# Patient Record
Sex: Male | Born: 1955
Health system: Southern US, Community
[De-identification: ages and names within clinical notes are randomized; demographics above are authoritative.]

## PROBLEM LIST (undated history)

## (undated) DIAGNOSIS — M720 Palmar fascial fibromatosis [Dupuytren]: Secondary | ICD-10-CM

## (undated) HISTORY — PX: CLEFT PALATE REPAIR: SUR1165

---

## 2019-06-01 DIAGNOSIS — F17210 Nicotine dependence, cigarettes, uncomplicated: Secondary | ICD-10-CM | POA: Diagnosis not present

## 2019-06-01 DIAGNOSIS — M1710 Unilateral primary osteoarthritis, unspecified knee: Secondary | ICD-10-CM | POA: Diagnosis not present

## 2019-06-01 DIAGNOSIS — Z299 Encounter for prophylactic measures, unspecified: Secondary | ICD-10-CM | POA: Diagnosis not present

## 2019-06-01 DIAGNOSIS — M720 Palmar fascial fibromatosis [Dupuytren]: Secondary | ICD-10-CM | POA: Diagnosis not present

## 2019-07-27 DIAGNOSIS — Z7189 Other specified counseling: Secondary | ICD-10-CM | POA: Diagnosis not present

## 2019-07-27 DIAGNOSIS — F17210 Nicotine dependence, cigarettes, uncomplicated: Secondary | ICD-10-CM | POA: Diagnosis not present

## 2019-07-27 DIAGNOSIS — Z1211 Encounter for screening for malignant neoplasm of colon: Secondary | ICD-10-CM | POA: Diagnosis not present

## 2019-07-27 DIAGNOSIS — Z299 Encounter for prophylactic measures, unspecified: Secondary | ICD-10-CM | POA: Diagnosis not present

## 2019-07-27 DIAGNOSIS — Z0000 Encounter for general adult medical examination without abnormal findings: Secondary | ICD-10-CM | POA: Diagnosis not present

## 2019-07-27 DIAGNOSIS — Z79899 Other long term (current) drug therapy: Secondary | ICD-10-CM | POA: Diagnosis not present

## 2019-08-16 DIAGNOSIS — M720 Palmar fascial fibromatosis [Dupuytren]: Secondary | ICD-10-CM | POA: Diagnosis not present

## 2019-09-01 DIAGNOSIS — M720 Palmar fascial fibromatosis [Dupuytren]: Secondary | ICD-10-CM | POA: Diagnosis not present

## 2019-09-02 ENCOUNTER — Other Ambulatory Visit: Payer: Self-pay | Admitting: Orthopedic Surgery

## 2019-09-10 DIAGNOSIS — Z713 Dietary counseling and surveillance: Secondary | ICD-10-CM | POA: Diagnosis not present

## 2019-09-10 DIAGNOSIS — Z299 Encounter for prophylactic measures, unspecified: Secondary | ICD-10-CM | POA: Diagnosis not present

## 2019-09-10 DIAGNOSIS — N200 Calculus of kidney: Secondary | ICD-10-CM | POA: Diagnosis not present

## 2019-09-10 DIAGNOSIS — M1990 Unspecified osteoarthritis, unspecified site: Secondary | ICD-10-CM | POA: Diagnosis not present

## 2019-10-04 ENCOUNTER — Encounter (HOSPITAL_BASED_OUTPATIENT_CLINIC_OR_DEPARTMENT_OTHER): Payer: Self-pay | Admitting: Orthopedic Surgery

## 2019-10-04 ENCOUNTER — Other Ambulatory Visit: Payer: Self-pay

## 2019-10-08 ENCOUNTER — Other Ambulatory Visit (HOSPITAL_COMMUNITY)
Admission: RE | Admit: 2019-10-08 | Discharge: 2019-10-08 | Disposition: A | Discharge status: Home / Self Care | Payer: Medicare Other | Source: Ambulatory Visit | Attending: Orthopedic Surgery | Admitting: Orthopedic Surgery

## 2019-10-08 DIAGNOSIS — Z20822 Contact with and (suspected) exposure to covid-19: Secondary | ICD-10-CM | POA: Insufficient documentation

## 2019-10-08 DIAGNOSIS — Z01812 Encounter for preprocedural laboratory examination: Secondary | ICD-10-CM | POA: Diagnosis not present

## 2019-10-08 LAB — SARS CORONAVIRUS 2 (TAT 6-24 HRS): SARS Coronavirus 2: NEGATIVE

## 2019-10-08 NOTE — Progress Notes (Signed)

## 2019-10-12 ENCOUNTER — Encounter (HOSPITAL_BASED_OUTPATIENT_CLINIC_OR_DEPARTMENT_OTHER): Payer: Self-pay | Admitting: Orthopedic Surgery

## 2019-10-12 ENCOUNTER — Ambulatory Visit (HOSPITAL_BASED_OUTPATIENT_CLINIC_OR_DEPARTMENT_OTHER): Payer: Medicare Other | Admitting: Anesthesiology

## 2019-10-12 ENCOUNTER — Encounter (HOSPITAL_BASED_OUTPATIENT_CLINIC_OR_DEPARTMENT_OTHER)
Admission: RE | Disposition: A | Discharge status: Home / Self Care | Payer: Self-pay | Source: Home / Self Care | Attending: Orthopedic Surgery

## 2019-10-12 ENCOUNTER — Other Ambulatory Visit: Payer: Self-pay

## 2019-10-12 ENCOUNTER — Ambulatory Visit (HOSPITAL_BASED_OUTPATIENT_CLINIC_OR_DEPARTMENT_OTHER)
Admission: RE | Admit: 2019-10-12 | Discharge: 2019-10-12 | Disposition: A | Discharge status: Home / Self Care | Payer: Medicare Other | Attending: Orthopedic Surgery | Admitting: Orthopedic Surgery

## 2019-10-12 DIAGNOSIS — M720 Palmar fascial fibromatosis [Dupuytren]: Secondary | ICD-10-CM | POA: Diagnosis not present

## 2019-10-12 DIAGNOSIS — F17210 Nicotine dependence, cigarettes, uncomplicated: Secondary | ICD-10-CM | POA: Diagnosis not present

## 2019-10-12 DIAGNOSIS — M1990 Unspecified osteoarthritis, unspecified site: Secondary | ICD-10-CM | POA: Insufficient documentation

## 2019-10-12 HISTORY — PX: FASCIECTOMY: SHX6525

## 2019-10-12 HISTORY — DX: Palmar fascial fibromatosis (dupuytren): M72.0

## 2019-10-12 SURGERY — FASCIECTOMY, PALM
Anesthesia: Monitor Anesthesia Care | Site: Hand | Laterality: Right

## 2019-10-12 MED ORDER — PHENYLEPHRINE 40 MCG/ML (10ML) SYRINGE FOR IV PUSH (FOR BLOOD PRESSURE SUPPORT)
PREFILLED_SYRINGE | INTRAVENOUS | Status: AC
  Filled 2019-10-12: qty 10

## 2019-10-12 MED ORDER — DIPHENHYDRAMINE HCL 50 MG/ML IJ SOLN
INTRAMUSCULAR | Status: AC
  Filled 2019-10-12: qty 1

## 2019-10-12 MED ORDER — HYDROMORPHONE HCL 1 MG/ML IJ SOLN: 0.2500 mg | INTRAMUSCULAR | Status: DC | PRN

## 2019-10-12 MED ORDER — SUCCINYLCHOLINE CHLORIDE 200 MG/10ML IV SOSY
PREFILLED_SYRINGE | INTRAVENOUS | Status: AC
  Filled 2019-10-12: qty 10

## 2019-10-12 MED ORDER — PROPOFOL 500 MG/50ML IV EMUL
INTRAVENOUS | Status: DC | PRN
  Administered 2019-10-12: 75 ug/kg/min via INTRAVENOUS

## 2019-10-12 MED ORDER — CEFAZOLIN SODIUM-DEXTROSE 2-4 GM/100ML-% IV SOLN
INTRAVENOUS | Status: AC
  Filled 2019-10-12: qty 100

## 2019-10-12 MED ORDER — MIDAZOLAM HCL 2 MG/2ML IJ SOLN
2.0000 mg | Freq: Once | INTRAMUSCULAR | Status: AC
  Administered 2019-10-12: 2 mg via INTRAVENOUS

## 2019-10-12 MED ORDER — FENTANYL CITRATE (PF) 100 MCG/2ML IJ SOLN
100.0000 ug | Freq: Once | INTRAMUSCULAR | Status: AC
  Administered 2019-10-12: 100 ug via INTRAVENOUS

## 2019-10-12 MED ORDER — TRAMADOL HCL 50 MG PO TABS: 50.0000 mg | ORAL_TABLET | Freq: Four times a day (QID) | ORAL | 0 refills | Status: DC | PRN

## 2019-10-12 MED ORDER — FENTANYL CITRATE (PF) 100 MCG/2ML IJ SOLN
INTRAMUSCULAR | Status: AC
  Filled 2019-10-12: qty 2

## 2019-10-12 MED ORDER — LACTATED RINGERS IV SOLN: INTRAVENOUS | Status: DC

## 2019-10-12 MED ORDER — EPHEDRINE 5 MG/ML INJ
INTRAVENOUS | Status: AC
  Filled 2019-10-12: qty 10

## 2019-10-12 MED ORDER — PROMETHAZINE HCL 25 MG/ML IJ SOLN: 6.2500 mg | INTRAMUSCULAR | Status: DC | PRN

## 2019-10-12 MED ORDER — LIDOCAINE 2% (20 MG/ML) 5 ML SYRINGE
INTRAMUSCULAR | Status: AC
  Filled 2019-10-12: qty 5

## 2019-10-12 MED ORDER — LIDOCAINE 2% (20 MG/ML) 5 ML SYRINGE
INTRAMUSCULAR | Status: DC | PRN
  Administered 2019-10-12: 30 mg via INTRAVENOUS

## 2019-10-12 MED ORDER — OXYCODONE HCL 5 MG/5ML PO SOLN: 5.0000 mg | Freq: Once | ORAL | Status: DC | PRN

## 2019-10-12 MED ORDER — CEFAZOLIN SODIUM-DEXTROSE 2-4 GM/100ML-% IV SOLN
2.0000 g | INTRAVENOUS | Status: AC
  Administered 2019-10-12: 2 g via INTRAVENOUS

## 2019-10-12 MED ORDER — DIPHENHYDRAMINE HCL 50 MG/ML IJ SOLN
INTRAMUSCULAR | Status: DC | PRN
  Administered 2019-10-12: 6.25 mg via INTRAVENOUS

## 2019-10-12 MED ORDER — THROMBIN 5000 UNITS EX SOLR
CUTANEOUS | Status: DC | PRN
  Administered 2019-10-12: 5000 [IU] via TOPICAL

## 2019-10-12 MED ORDER — MIDAZOLAM HCL 2 MG/2ML IJ SOLN
INTRAMUSCULAR | Status: AC
  Filled 2019-10-12: qty 2

## 2019-10-12 MED ORDER — ROPIVACAINE HCL 5 MG/ML IJ SOLN
INTRAMUSCULAR | Status: DC | PRN
Start: 2019-10-12 — End: 2019-10-12
  Administered 2019-10-12: 30 mL via PERINEURAL

## 2019-10-12 MED ORDER — ONDANSETRON HCL 4 MG/2ML IJ SOLN
INTRAMUSCULAR | Status: DC | PRN
  Administered 2019-10-12: 4 mg via INTRAVENOUS

## 2019-10-12 MED ORDER — OXYCODONE HCL 5 MG PO TABS: 5.0000 mg | ORAL_TABLET | Freq: Once | ORAL | Status: DC | PRN

## 2019-10-12 SURGICAL SUPPLY — 45 items
BLADE MINI RND TIP GREEN BEAV (BLADE) ×3 IMPLANT
BLADE SURG 15 STRL LF DISP TIS (BLADE) ×1 IMPLANT
BLADE SURG 15 STRL SS (BLADE) ×2
BNDG COHESIVE 3X5 TAN STRL LF (GAUZE/BANDAGES/DRESSINGS) ×6 IMPLANT
BNDG ESMARK 4X9 LF (GAUZE/BANDAGES/DRESSINGS) ×3 IMPLANT
BNDG GAUZE ELAST 4 BULKY (GAUZE/BANDAGES/DRESSINGS) ×3 IMPLANT
CHLORAPREP W/TINT 26 (MISCELLANEOUS) ×3 IMPLANT
CORD BIPOLAR FORCEPS 12FT (ELECTRODE) ×3 IMPLANT
COVER BACK TABLE 60X90IN (DRAPES) ×3 IMPLANT
COVER MAYO STAND STRL (DRAPES) ×3 IMPLANT
COVER WAND RF STERILE (DRAPES) IMPLANT
CUFF TOURN SGL QUICK 18X4 (TOURNIQUET CUFF) ×3 IMPLANT
DECANTER SPIKE VIAL GLASS SM (MISCELLANEOUS) IMPLANT
DRAPE EXTREMITY T 121X128X90 (DISPOSABLE) ×3 IMPLANT
DRAPE SURG 17X23 STRL (DRAPES) ×3 IMPLANT
DRSG PAD ABDOMINAL 8X10 ST (GAUZE/BANDAGES/DRESSINGS) ×3 IMPLANT
GAUZE SPONGE 4X4 12PLY STRL (GAUZE/BANDAGES/DRESSINGS) ×3 IMPLANT
GAUZE XEROFORM 1X8 LF (GAUZE/BANDAGES/DRESSINGS) ×3 IMPLANT
GLOVE BIO SURGEON STRL SZ7 (GLOVE) ×3 IMPLANT
GLOVE BIOGEL M STRL SZ7.5 (GLOVE) ×3 IMPLANT
GLOVE BIOGEL PI IND STRL 6.5 (GLOVE) ×2 IMPLANT
GLOVE BIOGEL PI IND STRL 8.5 (GLOVE) ×1 IMPLANT
GLOVE BIOGEL PI INDICATOR 6.5 (GLOVE) ×4
GLOVE BIOGEL PI INDICATOR 8.5 (GLOVE) ×2
GLOVE SURG ORTHO 8.0 STRL STRW (GLOVE) ×3 IMPLANT
GOWN STRL REUS W/ TWL LRG LVL3 (GOWN DISPOSABLE) ×1 IMPLANT
GOWN STRL REUS W/TWL LRG LVL3 (GOWN DISPOSABLE) ×2
GOWN STRL REUS W/TWL XL LVL3 (GOWN DISPOSABLE) ×6 IMPLANT
LOOP VESSEL MAXI BLUE (MISCELLANEOUS) ×3 IMPLANT
NEEDLE PRECISIONGLIDE 27X1.5 (NEEDLE) ×3 IMPLANT
NS IRRIG 1000ML POUR BTL (IV SOLUTION) ×3 IMPLANT
PAD CAST 3X4 CTTN HI CHSV (CAST SUPPLIES) ×1 IMPLANT
PADDING CAST COTTON 3X4 STRL (CAST SUPPLIES) ×2
SET BASIN DAY SURGERY F.S. (CUSTOM PROCEDURE TRAY) ×3 IMPLANT
SLEEVE SCD COMPRESS KNEE MED (MISCELLANEOUS) ×3 IMPLANT
SLING ARM FOAM STRAP LRG (SOFTGOODS) ×3 IMPLANT
SPLINT PLASTER CAST XFAST 3X15 (CAST SUPPLIES) ×10 IMPLANT
SPLINT PLASTER XTRA FASTSET 3X (CAST SUPPLIES) ×20
STOCKINETTE 4X48 STRL (DRAPES) ×3 IMPLANT
SUT ETHILON 4 0 PS 2 18 (SUTURE) ×9 IMPLANT
SUT SILK 2 0 PERMA HAND 18 BK (SUTURE) ×3 IMPLANT
SYR BULB EAR ULCER 3OZ GRN STR (SYRINGE) ×3 IMPLANT
SYR CONTROL 10ML LL (SYRINGE) ×3 IMPLANT
TOWEL GREEN STERILE FF (TOWEL DISPOSABLE) ×6 IMPLANT
UNDERPAD 30X36 HEAVY ABSORB (UNDERPADS AND DIAPERS) ×3 IMPLANT

## 2019-10-12 NOTE — Discharge Instructions (Addendum)

## 2019-10-12 NOTE — Brief Op Note (Signed)
10/12/2019  10:06 AM  PATIENT:  Wesley Hunt  64 y.o. male  PRE-OPERATIVE DIAGNOSIS:  DUPUYTRENS CONTRACTURE  POST-OPERATIVE DIAGNOSIS:  DUPUYTRENS CONTRACTURE  PROCEDURE:  Procedure(s): FASCIECTOMY RIGHT RING AND RIGHT SMALL FINGER (Right)  SURGEON:  Surgeon(s) and Role:    * Daryll Brod, MD - Primary  PHYSICIAN ASSISTANT:   ASSISTANTS: Leverne Humbles PA-C ANESTHESIA:   regional and IV sedation  EBL:  2 mL   BLOOD ADMINISTERED:none  DRAINS: Vessel loop drains to both wounds   LOCAL MEDICATIONS USED:  NONE  SPECIMEN:  Excision  DISPOSITION OF SPECIMEN:  PATHOLOGY  COUNTS:  YES  TOURNIQUET:   Total Tourniquet Time Documented: Upper Arm (Right) - 65 minutes Total: Upper Arm (Right) - 65 minutes   DICTATION: .Viviann Spare Dictation  PLAN OF CARE: Discharge to home after PACU  PATIENT DISPOSITION:  PACU - hemodynamically stable.

## 2019-10-12 NOTE — Anesthesia Procedure Notes (Signed)
Anesthesia Regional Block: Supraclavicular block   Pre-Anesthetic Checklist: ,, timeout performed, Correct Patient, Correct Site, Correct Laterality, Correct Procedure, Correct Position, site marked, Risks and benefits discussed,  Surgical consent,  Pre-op evaluation,  At surgeon's request and post-op pain management  Laterality: Right  Prep: chloraprep       Needles:  Injection technique: Single-shot  Needle Type: Stimiplex     Needle Length: 9cm  Needle Gauge: 21     Additional Needles:   Procedures:,,,, ultrasound used (permanent image in chart),,,,  Narrative:  Start time: 10/12/2019 7:24 AM End time: 10/12/2019 7:29 AM Injection made incrementally with aspirations every 5 mL.  Performed by: Personally  Anesthesiologist: Lynda Rainwater, MD

## 2019-10-12 NOTE — Progress Notes (Signed)
Assisted Dr. Sabra Heck with right, ultrasound guided, supraclavicular block. Side rails up, monitors on throughout procedure. See vital signs in flow sheet. Tolerated Procedure well.

## 2019-10-12 NOTE — H&P (Signed)
  Wesley Hunt is an 64 y.o. male.   Chief Complaint: contracture ringand small rightHPI: Wesley Hunt is a 64 year old right-hand-dominant male referred by Dr. Woody Seller for consultation regarding contractures of his ring and small fingers right hand. He states is been present approximately 3 years. Recalls no history of injury. He states his brother had a similar problem which I took care of. He does not know his ancestry. He does not have lumps on his feet he does not have curvature of his penis. He has no history of injury. Is not complaining of any numbness or tingling. He has a history of arthritis no history of diabetes thyroid problems or gout. Family history is negative for each of these.   Past Medical History:  Diagnosis Date  . Dupuytren's contracture     Past Surgical History:  Procedure Laterality Date  . CLEFT PALATE REPAIR      History reviewed. No pertinent family history. Social History:  reports that he has been smoking cigarettes. He has been smoking about 0.25 packs per day. He has never used smokeless tobacco. He reports that he does not drink alcohol and does not use drugs.  Allergies: No Known Allergies  No medications prior to admission.    No results found for this or any previous visit (from the past 48 hour(s)).  No results found.   Pertinent items are noted in HPI.  Height 5\' 7"  (1.702 m), weight 83.5 kg.  General appearance: alert, cooperative and appears stated age Head: Normocephalic, without obvious abnormality Neck: no JVD Resp: clear to auscultation bilaterally Cardio: regular rate and rhythm, S1, S2 normal, no murmur, click, rub or gallop GI: soft, non-tender; bowel sounds normal; no masses,  no organomegaly Extremities: contractures ring and small right hand Pulses: 2+ and symmetric Skin: Skin color, texture, turgor normal. No rashes or lesions Neurologic: Grossly normal Incision/Wound: na  Assessment/Plan Contracture of palmar fascia    Plan:  He would like to proceed to have this surgically taken care of with a fasciectomy rather than fasciotomy. He is aware of risk and complications including infection recurrence injury to arteries nerves tendons incomplete relief symptoms dystrophy recurrence possibility of loss of finger stiffness he would like to proceed in this will be scheduled as an outpatient under regional anesthesia fasciectomy ring and small fingers right hand.   Daryll Brod 10/12/2019, 5:32 AM

## 2019-10-12 NOTE — Transfer of Care (Signed)
Immediate Anesthesia Transfer of Care Note  Patient: Wesley Hunt  Procedure(s) Performed: FASCIECTOMY RIGHT RING AND RIGHT SMALL FINGER (Right Hand)  Patient Location: PACU  Anesthesia Type:MAC and MAC combined with regional for post-op pain  Level of Consciousness: sedated  Airway & Oxygen Therapy: Patient Spontanous Breathing and Patient connected to face mask oxygen  Post-op Assessment: Report given to RN and Post -op Vital signs reviewed and stable  Post vital signs: Reviewed and stable  Last Vitals:  Vitals Value Taken Time  BP    Temp    Pulse    Resp    SpO2      Last Pain:  Vitals:   10/12/19 0709  TempSrc: Oral      Patients Stated Pain Goal: 3 (09/28/[birth year] 1537)  Complications: No complications documented.

## 2019-10-12 NOTE — Anesthesia Preprocedure Evaluation (Signed)
Anesthesia Evaluation  Patient identified by MRN, date of birth, ID band Patient awake    Reviewed: Allergy & Precautions, NPO status , Patient's Chart, lab work & pertinent test results  Airway Mallampati: II  TM Distance: >3 FB Neck ROM: Full    Dental no notable dental hx.    Pulmonary neg pulmonary ROS, Current Smoker and Patient abstained from smoking.,    Pulmonary exam normal breath sounds clear to auscultation       Cardiovascular negative cardio ROS Normal cardiovascular exam Rhythm:Regular Rate:Normal     Neuro/Psych negative neurological ROS  negative psych ROS   GI/Hepatic negative GI ROS, Neg liver ROS,   Endo/Other  negative endocrine ROS  Renal/GU negative Renal ROS  negative genitourinary   Musculoskeletal negative musculoskeletal ROS (+)   Abdominal   Peds negative pediatric ROS (+)  Hematology negative hematology ROS (+)   Anesthesia Other Findings   Reproductive/Obstetrics negative OB ROS                             Anesthesia Physical Anesthesia Plan  ASA: II  Anesthesia Plan: MAC and Regional   Post-op Pain Management:  Regional for Post-op pain   Induction: Intravenous  PONV Risk Score and Plan: 0 and Treatment may vary due to age or medical condition  Airway Management Planned: Simple Face Mask  Additional Equipment:   Intra-op Plan:   Post-operative Plan:   Informed Consent: I have reviewed the patients History and Physical, chart, labs and discussed the procedure including the risks, benefits and alternatives for the proposed anesthesia with the patient or authorized representative who has indicated his/her understanding and acceptance.     Dental advisory given  Plan Discussed with: CRNA  Anesthesia Plan Comments:         Anesthesia Quick Evaluation

## 2019-10-12 NOTE — Op Note (Signed)
NAME: Wesley Hunt MEDICAL RECORD NO: [id number] DATE OF BIRTH: June 13, 1955 FAC[facility] LOCATION: [facility] PHYSICIAN: Wynonia Sours, MD   OPERATIVE REPORT   DATE OF PROCEDURE: 10/12/19    PREOPERATIVE DIAGNOSIS:   Dupuytren's contracture right ring right small fingers   POSTOPERATIVE DIAGNOSIS: Same  PROCEDURE:   Fasciectomy palmar fascia right ring and right small fingers   SURGEON: Daryll Brod, M.D.   ASSISTANT: Leverne Humbles, [facility]   ANESTHESIA:  Regional with sedation   INTRAVENOUS FLUIDS:  Per anesthesia flow sheet.   ESTIMATED BLOOD LOSS:  Minimal.   COMPLICATIONS:  None.   SPECIMENS:  : Fascia cord   TOURNIQUET TIME:    Total Tourniquet Time Documented: Upper Arm (Right) - 65 minutes Total: Upper Arm (Right) - 65 minutes    DISPOSITION:  Stable to PACU.   INDICATIONS: Patient is a 64 year old male with a history of contractures of his right ring and small fingers with Dupuytren's cords to each.  He is desirous having these removed.  Pre-peripostoperative course been discussed along with risk complications.  He is aware of alternative treatments including aponeurotomy collagenase injection fasciotomy fasciectomy and has elected fasciectomy.  He is aware that there is no guarantee to the surgery the possibility of infection recurrence injury to arteries nerves tendons incomplete relief symptoms distally the possibility of finger loss.  The preoperative area the patient is seen extremity marked by both patient and surgeon antibiotic given a supraclavicular block was carried out without difficulty under the direction the anesthesia department.  OPERATIVE COURSE: Patient brought to the operating room placed in supine position prepped and draped using ChloraPrep.  Right arm was free and a 3-minute dry time was allowed to a timeout taken to confirm patient procedure.  The limb was exsanguinated with an Esmarch bandage turn placed high on the arm was  inflated to 250 mmHg.  A volar Bruner incision was made on the small finger carried out to the middle phalanx.  Bleeders were electrocauterized with bipolar.  The dissection was carried down to the palmar fascia proximally.  A large dense cord was immediately apparent.  With blunt sharp dissection the skin was dissected free after identification of the neurovascular bundles proximally.  The dense cord was released to both the small and ring finger and the proximal aspect of the palmar fascia to the middle finger was also released proximally.  Dissection was carried distally there was a dense adherence to the A2 pulley which was relieved after protection of the neurovascular bundles both radially and ulnarly the dissection was then carried distally and inserted into the skin and at the level of the a 3 A4 pulleys.  With protection of the neurovascular bundles the cord was excised.  Attention was then directed towards the ring finger with which had a large cord from the small finger proceeding to the ring finger on the ulnar aspect.  The neurovascular bundle was identified proximally.  The cord to the ring finger was then excised after identification of the ulnar neurovascular bundle and radial neurovascular bundle.  The dorsal sensory nerve was identified going through the cord and this was protected on the ulnar aspect.  The insertion and the skin and into the distal portion of the A2 pulley was then identified with protection of the neurovascular bundle this was excised and the specimen sent to pathology.  Each of the wounds was then copiously irrigated with saline.  The PIP joint to the small finger came  fully straight at the metacarpal phalangeal joint to the ring and small were fully straight with no significant resistance to extension passively.  Thrombin was then instilled into the wound.  These were converted to wise and the wound was closed with interrupted 4-0 nylon sutures over doubled over vessel loop  drain.  A sterile compressive dressing was applied.  The tourniquet was deflated all fingers pinked a dorsal splint was applied for comfort and the patient was taken to the recovery room for observation in satisfactory condition.  He will be discharged home to return to the hand center [facility] in 1 week on Tylenol ibuprofen for pain with Ultram for breakthrough.   Daryll Brod, MD Electronically signed, 10/12/19

## 2019-10-12 NOTE — Anesthesia Postprocedure Evaluation (Signed)
Anesthesia Post Note  Patient: Wesley Hunt  Procedure(s) Performed: FASCIECTOMY RIGHT RING AND RIGHT SMALL FINGER (Right Hand)     Patient location during evaluation: PACU Anesthesia Type: Regional Level of consciousness: awake and alert Pain management: pain level controlled Vital Signs Assessment: post-procedure vital signs reviewed and stable Respiratory status: spontaneous breathing, nonlabored ventilation and respiratory function stable Cardiovascular status: blood pressure returned to baseline and stable Postop Assessment: no apparent nausea or vomiting Anesthetic complications: no   No complications documented.  Last Vitals:  Vitals:   10/12/19 1044 10/12/19 1045  BP: (!) 187/98 (!) 164/81  Pulse: 64 70  Resp: 19 18  Temp:    SpO2: (!) 89% (!) 89%    Last Pain:  Vitals:   10/12/19 1045  TempSrc:   PainSc: 0-No pain                 Lynda Rainwater

## 2019-10-13 ENCOUNTER — Encounter (HOSPITAL_BASED_OUTPATIENT_CLINIC_OR_DEPARTMENT_OTHER): Payer: Self-pay | Admitting: Orthopedic Surgery

## 2019-10-13 LAB — SURGICAL PATHOLOGY

## 2019-10-18 DIAGNOSIS — M720 Palmar fascial fibromatosis [Dupuytren]: Secondary | ICD-10-CM | POA: Diagnosis not present

## 2019-10-18 DIAGNOSIS — M25641 Stiffness of right hand, not elsewhere classified: Secondary | ICD-10-CM | POA: Diagnosis not present

## 2019-10-18 DIAGNOSIS — M79644 Pain in right finger(s): Secondary | ICD-10-CM | POA: Diagnosis not present

## 2019-10-18 DIAGNOSIS — T148XXA Other injury of unspecified body region, initial encounter: Secondary | ICD-10-CM | POA: Diagnosis not present

## 2019-10-25 DIAGNOSIS — T148XXA Other injury of unspecified body region, initial encounter: Secondary | ICD-10-CM | POA: Diagnosis not present

## 2019-10-25 DIAGNOSIS — M25641 Stiffness of right hand, not elsewhere classified: Secondary | ICD-10-CM | POA: Diagnosis not present

## 2019-10-25 DIAGNOSIS — M79644 Pain in right finger(s): Secondary | ICD-10-CM | POA: Diagnosis not present

## 2019-10-25 DIAGNOSIS — M720 Palmar fascial fibromatosis [Dupuytren]: Secondary | ICD-10-CM | POA: Diagnosis not present

## 2019-12-29 DIAGNOSIS — Z299 Encounter for prophylactic measures, unspecified: Secondary | ICD-10-CM | POA: Diagnosis not present

## 2019-12-29 DIAGNOSIS — M1710 Unilateral primary osteoarthritis, unspecified knee: Secondary | ICD-10-CM | POA: Diagnosis not present

## 2019-12-29 DIAGNOSIS — D692 Other nonthrombocytopenic purpura: Secondary | ICD-10-CM | POA: Diagnosis not present

## 2020-02-29 DIAGNOSIS — F17210 Nicotine dependence, cigarettes, uncomplicated: Secondary | ICD-10-CM | POA: Diagnosis not present

## 2020-02-29 DIAGNOSIS — M7989 Other specified soft tissue disorders: Secondary | ICD-10-CM | POA: Diagnosis not present

## 2020-02-29 DIAGNOSIS — M1710 Unilateral primary osteoarthritis, unspecified knee: Secondary | ICD-10-CM | POA: Diagnosis not present

## 2020-02-29 DIAGNOSIS — M1990 Unspecified osteoarthritis, unspecified site: Secondary | ICD-10-CM | POA: Diagnosis not present

## 2020-02-29 DIAGNOSIS — Z299 Encounter for prophylactic measures, unspecified: Secondary | ICD-10-CM | POA: Diagnosis not present

## 2020-03-16 DIAGNOSIS — M1990 Unspecified osteoarthritis, unspecified site: Secondary | ICD-10-CM | POA: Diagnosis not present

## 2020-03-16 DIAGNOSIS — Z299 Encounter for prophylactic measures, unspecified: Secondary | ICD-10-CM | POA: Diagnosis not present

## 2020-04-13 DIAGNOSIS — M1710 Unilateral primary osteoarthritis, unspecified knee: Secondary | ICD-10-CM | POA: Diagnosis not present

## 2020-04-13 DIAGNOSIS — Z299 Encounter for prophylactic measures, unspecified: Secondary | ICD-10-CM | POA: Diagnosis not present

## 2020-04-13 DIAGNOSIS — Z2821 Immunization not carried out because of patient refusal: Secondary | ICD-10-CM | POA: Diagnosis not present

## 2020-04-13 DIAGNOSIS — Z713 Dietary counseling and surveillance: Secondary | ICD-10-CM | POA: Diagnosis not present

## 2020-04-13 DIAGNOSIS — F17210 Nicotine dependence, cigarettes, uncomplicated: Secondary | ICD-10-CM | POA: Diagnosis not present

## 2020-04-25 DIAGNOSIS — T887XXA Unspecified adverse effect of drug or medicament, initial encounter: Secondary | ICD-10-CM | POA: Diagnosis not present

## 2020-04-25 DIAGNOSIS — R52 Pain, unspecified: Secondary | ICD-10-CM | POA: Diagnosis not present

## 2020-04-25 DIAGNOSIS — R0902 Hypoxemia: Secondary | ICD-10-CM | POA: Diagnosis not present

## 2020-04-25 DIAGNOSIS — I10 Essential (primary) hypertension: Secondary | ICD-10-CM | POA: Diagnosis not present

## 2020-04-25 DIAGNOSIS — T50904A Poisoning by unspecified drugs, medicaments and biological substances, undetermined, initial encounter: Secondary | ICD-10-CM | POA: Diagnosis not present

## 2020-04-27 DIAGNOSIS — M1710 Unilateral primary osteoarthritis, unspecified knee: Secondary | ICD-10-CM | POA: Diagnosis not present

## 2020-04-27 DIAGNOSIS — Z713 Dietary counseling and surveillance: Secondary | ICD-10-CM | POA: Diagnosis not present

## 2020-04-27 DIAGNOSIS — Z299 Encounter for prophylactic measures, unspecified: Secondary | ICD-10-CM | POA: Diagnosis not present

## 2020-05-10 DIAGNOSIS — Z299 Encounter for prophylactic measures, unspecified: Secondary | ICD-10-CM | POA: Diagnosis not present

## 2020-05-10 DIAGNOSIS — F17210 Nicotine dependence, cigarettes, uncomplicated: Secondary | ICD-10-CM | POA: Diagnosis not present

## 2020-05-10 DIAGNOSIS — M1710 Unilateral primary osteoarthritis, unspecified knee: Secondary | ICD-10-CM | POA: Diagnosis not present

## 2020-05-10 DIAGNOSIS — Z79899 Other long term (current) drug therapy: Secondary | ICD-10-CM | POA: Diagnosis not present

## 2020-06-08 DIAGNOSIS — Z299 Encounter for prophylactic measures, unspecified: Secondary | ICD-10-CM | POA: Diagnosis not present

## 2020-06-08 DIAGNOSIS — M1710 Unilateral primary osteoarthritis, unspecified knee: Secondary | ICD-10-CM | POA: Diagnosis not present

## 2020-06-08 DIAGNOSIS — M542 Cervicalgia: Secondary | ICD-10-CM | POA: Diagnosis not present

## 2020-06-08 DIAGNOSIS — D692 Other nonthrombocytopenic purpura: Secondary | ICD-10-CM | POA: Diagnosis not present

## 2020-07-06 DIAGNOSIS — Z299 Encounter for prophylactic measures, unspecified: Secondary | ICD-10-CM | POA: Diagnosis not present

## 2020-07-06 DIAGNOSIS — Z79899 Other long term (current) drug therapy: Secondary | ICD-10-CM | POA: Diagnosis not present

## 2020-07-06 DIAGNOSIS — M1990 Unspecified osteoarthritis, unspecified site: Secondary | ICD-10-CM | POA: Diagnosis not present

## 2020-07-31 DIAGNOSIS — Z0000 Encounter for general adult medical examination without abnormal findings: Secondary | ICD-10-CM | POA: Diagnosis not present

## 2020-07-31 DIAGNOSIS — Z7189 Other specified counseling: Secondary | ICD-10-CM | POA: Diagnosis not present

## 2020-07-31 DIAGNOSIS — M1990 Unspecified osteoarthritis, unspecified site: Secondary | ICD-10-CM | POA: Diagnosis not present

## 2020-07-31 DIAGNOSIS — Z79899 Other long term (current) drug therapy: Secondary | ICD-10-CM | POA: Diagnosis not present

## 2020-07-31 DIAGNOSIS — Z299 Encounter for prophylactic measures, unspecified: Secondary | ICD-10-CM | POA: Diagnosis not present

## 2020-07-31 DIAGNOSIS — F17210 Nicotine dependence, cigarettes, uncomplicated: Secondary | ICD-10-CM | POA: Diagnosis not present

## 2020-08-03 DIAGNOSIS — M25511 Pain in right shoulder: Secondary | ICD-10-CM | POA: Diagnosis not present

## 2020-08-03 DIAGNOSIS — R0602 Shortness of breath: Secondary | ICD-10-CM | POA: Diagnosis not present

## 2020-08-03 DIAGNOSIS — M542 Cervicalgia: Secondary | ICD-10-CM | POA: Diagnosis not present

## 2020-08-03 DIAGNOSIS — J90 Pleural effusion, not elsewhere classified: Secondary | ICD-10-CM | POA: Diagnosis not present

## 2020-08-03 DIAGNOSIS — R059 Cough, unspecified: Secondary | ICD-10-CM | POA: Diagnosis not present

## 2020-08-07 DIAGNOSIS — R918 Other nonspecific abnormal finding of lung field: Secondary | ICD-10-CM | POA: Diagnosis not present

## 2020-08-07 DIAGNOSIS — M542 Cervicalgia: Secondary | ICD-10-CM | POA: Diagnosis not present

## 2020-08-07 DIAGNOSIS — Z299 Encounter for prophylactic measures, unspecified: Secondary | ICD-10-CM | POA: Diagnosis not present

## 2020-08-14 DIAGNOSIS — R0602 Shortness of breath: Secondary | ICD-10-CM | POA: Diagnosis not present

## 2020-08-14 DIAGNOSIS — R918 Other nonspecific abnormal finding of lung field: Secondary | ICD-10-CM | POA: Diagnosis not present

## 2020-08-17 DIAGNOSIS — Z299 Encounter for prophylactic measures, unspecified: Secondary | ICD-10-CM | POA: Diagnosis not present

## 2020-08-17 DIAGNOSIS — C342 Malignant neoplasm of middle lobe, bronchus or lung: Secondary | ICD-10-CM | POA: Diagnosis not present

## 2020-08-17 DIAGNOSIS — K7460 Unspecified cirrhosis of liver: Secondary | ICD-10-CM | POA: Diagnosis not present

## 2020-08-17 DIAGNOSIS — M542 Cervicalgia: Secondary | ICD-10-CM | POA: Diagnosis not present

## 2020-08-17 DIAGNOSIS — F17210 Nicotine dependence, cigarettes, uncomplicated: Secondary | ICD-10-CM | POA: Diagnosis not present

## 2020-08-17 DIAGNOSIS — I700 Atherosclerosis of aorta: Secondary | ICD-10-CM | POA: Diagnosis not present

## 2020-08-23 DIAGNOSIS — Z1212 Encounter for screening for malignant neoplasm of rectum: Secondary | ICD-10-CM | POA: Diagnosis not present

## 2020-08-23 DIAGNOSIS — Z1211 Encounter for screening for malignant neoplasm of colon: Secondary | ICD-10-CM | POA: Diagnosis not present

## 2020-08-25 ENCOUNTER — Other Ambulatory Visit: Payer: Self-pay

## 2020-08-25 ENCOUNTER — Encounter: Payer: Self-pay | Admitting: Pulmonary Disease

## 2020-08-25 ENCOUNTER — Ambulatory Visit: Payer: Medicare Other | Admitting: Pulmonary Disease

## 2020-08-25 VITALS — BP 146/70 | HR 70 | Temp 98.2°F | Ht 67.0 in | Wt 144.8 lb

## 2020-08-25 DIAGNOSIS — C3491 Malignant neoplasm of unspecified part of right bronchus or lung: Secondary | ICD-10-CM

## 2020-08-25 MED ORDER — NICOTINE 14 MG/24HR TD PT24: 14.0000 mg | MEDICATED_PATCH | Freq: Every day | TRANSDERMAL | 0 refills | Status: DC

## 2020-08-25 NOTE — Progress Notes (Signed)
Pulmonary, Critical Care, and Sleep Medicine  Chief Complaint  Patient presents with  . Follow-up    Pain on right side of trunk, productive cough with little bit of yellow phlegm for about 4 weeks , sore throat     Constitutional:  BP (!) 146/70 (BP Location: Left Arm, Cuff Size: Normal)   Pulse 70   Temp 98.2 F (36.8 C) (Temporal)   Ht 5\' 7"  (1.702 m)   Wt 144 lb 12.8 oz (65.7 kg)   SpO2 95% Comment: Room air  BMI 22.68 kg/m   Past Medical History:  Dupuytren's contracture, Hepatitis C with cirrhosis  Past Surgical History:  He  has a past surgical history that includes Cleft palate repair and Fasciectomy (Right, 10/12/2019).  Brief Summary:  Wesley Hunt is a 65 y.o. male smoker with lung mass.      Subjective:   He is here with his brother.  He was having pain in his neck and right shoulder area.  This led to a series of xrays and finally CT chest on 08/14/20.  This showed changes concerning for metastatic lung cancer.  He has cough with clear sputum.  Has been getting winded with activity more easily than usual.  Has lost about 50 lbs over the past few months.  No problem with vision or change in voice quality.  Hasn't notice difficulty swallowing or hemoptysis.  No GI symptoms.  Hasn't had leg swelling, skin rash, or gland swelling.  He continues to smoke 1 pack per day.  He reports being treated for TB in the 1970's.  He had pneumonia years ago.  He was in the [employer] in the 1970's.  No history of asbestos exposure.  He reports having hepatitis several years ago and being treated with medicine for 12 weeks, and this left him with cirrhosis.  He doesn't drink alcohol.  Physical Exam:   Appearance - well kempt   ENMT - no sinus tenderness, no oral exudate, no LAN, Mallampati 2 airway, no stridor, wears dentures, changes from cleft palate  Respiratory - decreased breath sounds on right, no wheezing or rales  CV - s1s2 regular rate and rhythm, no  murmurs  Ext - no clubbing, no edema  Skin - no rashes  Psych - normal mood and affect   Pulmonary testing:    Chest Imaging:   CT chest 08/14/20 >> complete collapse of RML with soft tissue density along medial RUL with multiple enlarged mediastinal and Rt hilar nodes, b/l pulmonary nodules, small Rt pleural effusion, cirrhosis with splenomegaly  Social History:  He  reports that he has been smoking cigarettes. He has been smoking about 0.25 packs per day. He has never used smokeless tobacco. He reports that he does not drink alcohol and does not use drugs.  Family History:  Several family members with cancer.   Discussion:  He has extensive history of tobacco abuse and family history of cancer.  He has recent weight loss, cough, dyspnea, and right sided neck/shoulder pain.  His CT chest imaging is concerning for primary lung cancer that is likely metastatic.  Assessment/Plan:   Lung mass with adenopathy and satellite nodules. - will arrange for PET scan and then determine best approach for tissue sampling - assuming this is primary lung cancer, he would then need referral to oncology  Tobacco abuse.   - reviewed options to help quit smoking - he will try nicotine patch  Cirrhosis. - reports history of viral hepatitis (?hepatitis  c)  Time Spent Involved in Patient Care on Day of Examination:  37 minutes  Follow up:  Patient Instructions  Will arrange for PET scan and call with results, and then arrange for biopsy  Follow up in 6 weeks   Medication List:   Allergies as of 08/25/2020   No Known Allergies     Medication List       Accurate as of August 25, 2020  1:20 PM. If you have any questions, ask your nurse or doctor.        STOP taking these medications   traMADol 50 MG tablet Commonly known as: Ultram Stopped by: Chesley Mires, MD     TAKE these medications   HYDROcodone-acetaminophen 5-325 MG tablet Commonly known as: NORCO/VICODIN Take 1 tablet  by mouth 4 (four) times daily as needed.   nicotine 14 mg/24hr patch Commonly known as: NICODERM CQ - dosed in mg/24 hours Place 1 patch (14 mg total) onto the skin daily. Started by: Chesley Mires, MD       Signature:  Chesley Mires, MD [place] Pager - [phone] 08/25/2020, 1:20 PM

## 2020-08-25 NOTE — Patient Instructions (Signed)
Will arrange for PET scan and call with results, and then arrange for biopsy  Follow up in 6 weeks

## 2020-08-28 DIAGNOSIS — C3491 Malignant neoplasm of unspecified part of right bronchus or lung: Secondary | ICD-10-CM | POA: Diagnosis not present

## 2020-08-28 DIAGNOSIS — F17210 Nicotine dependence, cigarettes, uncomplicated: Secondary | ICD-10-CM | POA: Diagnosis not present

## 2020-08-28 DIAGNOSIS — K7460 Unspecified cirrhosis of liver: Secondary | ICD-10-CM | POA: Diagnosis not present

## 2020-08-28 DIAGNOSIS — C342 Malignant neoplasm of middle lobe, bronchus or lung: Secondary | ICD-10-CM | POA: Diagnosis not present

## 2020-08-28 DIAGNOSIS — Z299 Encounter for prophylactic measures, unspecified: Secondary | ICD-10-CM | POA: Diagnosis not present

## 2020-08-30 ENCOUNTER — Institutional Professional Consult (permissible substitution): Payer: Medicare Other | Admitting: Emergency Medicine

## 2020-09-07 ENCOUNTER — Encounter (INDEPENDENT_AMBULATORY_CARE_PROVIDER_SITE_OTHER): Payer: Self-pay | Admitting: Gastroenterology

## 2020-09-15 DIAGNOSIS — Z299 Encounter for prophylactic measures, unspecified: Secondary | ICD-10-CM | POA: Diagnosis not present

## 2020-09-15 DIAGNOSIS — M542 Cervicalgia: Secondary | ICD-10-CM | POA: Diagnosis not present

## 2020-09-15 DIAGNOSIS — J449 Chronic obstructive pulmonary disease, unspecified: Secondary | ICD-10-CM | POA: Diagnosis not present

## 2020-09-15 DIAGNOSIS — I700 Atherosclerosis of aorta: Secondary | ICD-10-CM | POA: Diagnosis not present

## 2020-09-15 DIAGNOSIS — C3491 Malignant neoplasm of unspecified part of right bronchus or lung: Secondary | ICD-10-CM | POA: Diagnosis not present

## 2020-09-18 ENCOUNTER — Encounter (INDEPENDENT_AMBULATORY_CARE_PROVIDER_SITE_OTHER): Payer: Self-pay | Admitting: Gastroenterology

## 2020-09-18 ENCOUNTER — Encounter (HOSPITAL_COMMUNITY)
Admission: RE | Admit: 2020-09-18 | Discharge: 2020-09-18 | Disposition: A | Discharge status: Home / Self Care | Payer: Medicare Other | Source: Ambulatory Visit | Attending: Pulmonary Disease | Admitting: Pulmonary Disease

## 2020-09-18 ENCOUNTER — Telehealth (INDEPENDENT_AMBULATORY_CARE_PROVIDER_SITE_OTHER): Payer: Medicare Other | Admitting: Gastroenterology

## 2020-09-18 ENCOUNTER — Other Ambulatory Visit: Payer: Self-pay

## 2020-09-18 DIAGNOSIS — R195 Other fecal abnormalities: Secondary | ICD-10-CM | POA: Insufficient documentation

## 2020-09-18 DIAGNOSIS — C3491 Malignant neoplasm of unspecified part of right bronchus or lung: Secondary | ICD-10-CM | POA: Diagnosis not present

## 2020-09-18 DIAGNOSIS — R932 Abnormal findings on diagnostic imaging of liver and biliary tract: Secondary | ICD-10-CM

## 2020-09-18 DIAGNOSIS — R911 Solitary pulmonary nodule: Secondary | ICD-10-CM | POA: Diagnosis not present

## 2020-09-18 MED ORDER — FLUDEOXYGLUCOSE F - 18 (FDG) INJECTION
8.6200 | Freq: Once | INTRAVENOUS | Status: AC | PRN
  Administered 2020-09-18: 8.62 via INTRAVENOUS

## 2020-09-18 NOTE — Patient Instructions (Signed)
Schedule colonoscopy Schedule liver elastography Perform blood workup

## 2020-09-18 NOTE — Progress Notes (Signed)
Maylon Peppers, M.D. Gastroenterology & Hepatology [facility] [address] [place], [place] [address] Primary Care Physician: Glenda Chroman, MD [address] [place] [address]  Referring MD: PCP  This is a telephone virtual visit.  It required patient-provider interaction for the medical decision making as documented below. The patient has consented and agreed to proceed with a Telehealth encounter given the current Coronavirus pandemic.  VIRTUAL VISIT NOTE Patient location: home  Provider location: home  I will communicate my assessment and recommendations to the referring MD via EMR.  Chief Complaint: Positive Cologuard  History of Present Illness: Wesley Hunt is a 65 y.o. male with PMH presumed lung cancer, who presents for evaluation of positive Cologuard.  Patient reports that close to 4 weeks ago he had a positive Cologuard testing. He had presented some black stools at the time of stool collection, he believes the stool was black. Denies any hematochezia, nausea, vomiting, fever, chills, hematochezia, melena, hematemesis, abdominal distention, abdominal pain, diarrhea, jaundice, pruritus. He lost 36 lb in the last 2 months but has stabilized recently, he believes it was related to a change in his appetite but he has been concerned as he is currently undergoing an evaluation of a mass in his lung. The patient was seen by Dr. Halford Chessman on 08/25/2020 as part of the evaluation of a CT of the chest performed on 08/14/2020 showing complete collapse of the right middle lobe with multiple enlarged mediastinal and right hilar nodes and bilateral pulmonary nodules.  Notably, during this test she was found to have changes suggestive of cirrhosis and mild splenomegaly.. Denies any early satiety. Weight has increase for the last couple of weeks slightly.  Last EGD: never Last Colonoscopy: never  FHx: neg for any gastrointestinal/liver disease,  sister ovarian cancer, grandparents had some type of cancer (lung, breast and brain) Social: smokes 8 cigs a day, neg alcohol or illicit drug use Used to drink heavily in the past, at least a couple of beers every day but stopped 15 years Surgical: non contributory  Past Medical History: Past Medical History:  Diagnosis Date  . Dupuytren's contracture     Past Surgical History: Past Surgical History:  Procedure Laterality Date  . CLEFT PALATE REPAIR    . FASCIECTOMY Right 10/12/2019   Procedure: FASCIECTOMY RIGHT RING AND RIGHT SMALL FINGER;  Surgeon: Daryll Brod, MD;  Location: [facility];  Service: Orthopedics;  Laterality: Right;    Family History:History reviewed. No pertinent family history.  Social History: Social History   Tobacco Use  Smoking Status Current Every Day Smoker  . Packs/day: 0.50  . Types: Cigarettes  Smokeless Tobacco Never Used  Tobacco Comment   smokes 5 cigarettes per day 08/25/20   Social History   Substance and Sexual Activity  Alcohol Use Never   Social History   Substance and Sexual Activity  Drug Use Never    Allergies: No Known Allergies  Medications: Current Outpatient Medications  Medication Sig Dispense Refill  . HYDROcodone-acetaminophen (NORCO) 10-325 MG tablet Take 1 tablet by mouth every 6 (six) hours as needed.     No current facility-administered medications for this visit.    Review of Systems: GENERAL: negative for malaise, night sweats HEENT: No changes in hearing or vision, no nose bleeds or other nasal problems. NECK: Negative for lumps, goiter, pain and significant neck swelling RESPIRATORY: Negative for cough, wheezing CARDIOVASCULAR: Negative for chest pain, leg swelling, palpitations, orthopnea GI: SEE  HPI MUSCULOSKELETAL: Negative for joint pain or swelling, back pain, and muscle pain. SKIN: Negative for lesions, rash PSYCH: Negative for sleep disturbance, mood disorder and recent  psychosocial stressors. HEMATOLOGY Negative for prolonged bleeding, bruising easily, and swollen nodes. ENDOCRINE: Negative for cold or heat intolerance, polyuria, polydipsia and goiter. NEURO: negative for tremor, gait imbalance, syncope and seizures. The remainder of the review of systems is noncontributory.   Physical Exam: No exam was performed as this was a telephone encounter  Imaging/Labs: as above  I personally reviewed and interpreted the available labs, imaging and endoscopic files.  Impression and Plan: CASIMER RUSSETT is a 65 y.o. male with PMH presumed lung cancer, who presents for evaluation of positive Cologuard.  The patient has never had a colonoscopy in the past and is at average risk for colorectal cancer.  He had questionable episodes of melena, we will explore further his positive testing with a colonoscopy.  On the other hand, he had imaging performed in the past which showed changes suggestive of cirrhosis and splenomegaly.  At this moment, I would like to check his hepatic synthetic function with MELD labs, but also will schedule him for a liver elastography to confirm if he has increased hepatic stiffness.  Explained to him that if the results are discordant, there is a possibility we will need to perform a liver biopsy.  We will check for other causes of chronic liver disease, but it is possible to if he has cirrhosis this was related to chronic alcohol intake which he has stopped.  - Schedule colonoscopy - Schedule liver elastography - Check MELD labs, hepatitis A/B/C serologies, iron panel, ANA, AMA, ASMA, IgG All questions were answered.      Total visit time: I spent a total of  45 minutes  Maylon Peppers, MD Gastroenterology and Hepatology [facility]

## 2020-09-20 ENCOUNTER — Ambulatory Visit (INDEPENDENT_AMBULATORY_CARE_PROVIDER_SITE_OTHER): Payer: Medicare Other | Admitting: Gastroenterology

## 2020-09-20 ENCOUNTER — Other Ambulatory Visit (INDEPENDENT_AMBULATORY_CARE_PROVIDER_SITE_OTHER): Payer: Self-pay

## 2020-09-20 ENCOUNTER — Telehealth (INDEPENDENT_AMBULATORY_CARE_PROVIDER_SITE_OTHER): Payer: Self-pay

## 2020-09-20 ENCOUNTER — Encounter (INDEPENDENT_AMBULATORY_CARE_PROVIDER_SITE_OTHER): Payer: Self-pay

## 2020-09-20 ENCOUNTER — Telehealth: Payer: Self-pay | Admitting: Internal Medicine

## 2020-09-20 ENCOUNTER — Encounter: Payer: Self-pay | Admitting: *Deleted

## 2020-09-20 ENCOUNTER — Telehealth: Payer: Self-pay | Admitting: Pulmonary Disease

## 2020-09-20 DIAGNOSIS — C799 Secondary malignant neoplasm of unspecified site: Secondary | ICD-10-CM

## 2020-09-20 DIAGNOSIS — R932 Abnormal findings on diagnostic imaging of liver and biliary tract: Secondary | ICD-10-CM

## 2020-09-20 MED ORDER — PEG 3350-KCL-NA BICARB-NACL 420 G PO SOLR: 4000.0000 mL | ORAL | 0 refills | Status: DC

## 2020-09-20 MED ORDER — MAGNESIUM CITRATE PO SOLN: 1.0000 | Freq: Once | ORAL | 0 refills | Status: AC

## 2020-09-20 NOTE — Telephone Encounter (Signed)
LeighAnn Jedd Schulenburg, CMA  

## 2020-09-20 NOTE — Telephone Encounter (Signed)
Received a new patient referral from Dr. Halford Chessman for Metastatic malignant neoplasm. Wesley Hunt has been scheduled to see Dr. Julien Nordmann on 6/7 at 2;15pm w/labs at 1:45pm. I cld and lft the appt date and time on the pt's vm. Letter mailed.

## 2020-09-20 NOTE — Telephone Encounter (Signed)
PET scan 09/18/20 >> multiple areas of intense metabolism  Results discussed with pt and his niece over the phone.  Will arrange for referral to interventional radiology to assess for biopsy of Rt supraclavicular node.  Will also arrange for referral to oncology.  He is to also keep his follow up with me on 10/06/20.

## 2020-09-20 NOTE — Progress Notes (Signed)
I received referral on Wesley Hunt. She has bx coming up on 6/1 and updated new patient coordinator to call and schedule her on 6/7.  Per Dr. Julien Nordmann, referral to rad onc due to bone mets.

## 2020-09-21 ENCOUNTER — Encounter: Payer: Self-pay | Admitting: *Deleted

## 2020-09-21 DIAGNOSIS — R918 Other nonspecific abnormal finding of lung field: Secondary | ICD-10-CM

## 2020-09-21 NOTE — Progress Notes (Signed)
Per Dr. Julien Nordmann, he would like CMP, order completed

## 2020-09-26 DIAGNOSIS — R0902 Hypoxemia: Secondary | ICD-10-CM | POA: Diagnosis not present

## 2020-09-26 DIAGNOSIS — T887XXA Unspecified adverse effect of drug or medicament, initial encounter: Secondary | ICD-10-CM | POA: Diagnosis not present

## 2020-09-26 DIAGNOSIS — T50904A Poisoning by unspecified drugs, medicaments and biological substances, undetermined, initial encounter: Secondary | ICD-10-CM | POA: Diagnosis not present

## 2020-09-27 ENCOUNTER — Telehealth: Payer: Self-pay | Admitting: Pulmonary Disease

## 2020-09-27 ENCOUNTER — Ambulatory Visit (HOSPITAL_COMMUNITY): Payer: Medicare Other

## 2020-09-27 ENCOUNTER — Encounter (HOSPITAL_COMMUNITY): Payer: Self-pay

## 2020-09-27 NOTE — Telephone Encounter (Signed)
Received call from [facility] with [facility] who states that patient having symptom of shortness of breath. Patient has a dry cough. States he has had more shortness of breath for about 2-3 weeks, has no energy, worse when he lays flat, worse with exertion. Denies any fevers  Judson Roch please advise as Dr. Halford Chessman is off today

## 2020-09-27 NOTE — Progress Notes (Signed)
[place]    1       Lajean Silvius Male, 65 y.o., 1955-09-22  MRN:  [id number] Phone:  [phone] Jerilynn Mages)       PCP:  Glenda Chroman, MD Primary Cvg:  [employer]  Next Appt With Radiology ([facility]) 10/05/2020 at 1:00 PM           RE: Biopsy Received: 6 days ago Suttle, Rosanne Ashing, MD  [facility]  Approved for ultrasound guided right supraclavicular lymph node biopsy.   Dylan        Previous Messages   ----- Message -----  From: Lenore Cordia  Sent: 09/21/2020  4:01 PM EDT  To: [facility] Procedure Requests  Subject: Biopsy                      Procedure Requested:CT US Guided Biopsy    Reason for Procedure: metastatic cancer; assess for biopsy of right supraclavicular lymph node    Provider Requesting: Chesley Mires    Provider Telephone: [phone]

## 2020-09-27 NOTE — Telephone Encounter (Signed)
Called and spoke with pt and he stated that he was not at [facility] but he has an appt there next week and stated that he did not feel that he would be able to make it to that appt.   I offered him an appt in the [facility] office tomorrow and then his niece got on the phone and stated that she did call and got him an appt with his PCP in the am.   Nothing further is needed.

## 2020-09-27 NOTE — Telephone Encounter (Signed)
Is the patient having a CXR there? Why is he I the radiology dept?  Sounds like he needs a CXR, and appointment .  If his shortness of breath too bad to wait until an appointment can be set up, he should seek emergency care in the ED.

## 2020-09-28 DIAGNOSIS — J440 Chronic obstructive pulmonary disease with acute lower respiratory infection: Secondary | ICD-10-CM | POA: Diagnosis not present

## 2020-09-28 DIAGNOSIS — C3491 Malignant neoplasm of unspecified part of right bronchus or lung: Secondary | ICD-10-CM | POA: Diagnosis not present

## 2020-09-28 DIAGNOSIS — R0602 Shortness of breath: Secondary | ICD-10-CM | POA: Diagnosis not present

## 2020-09-28 DIAGNOSIS — Z299 Encounter for prophylactic measures, unspecified: Secondary | ICD-10-CM | POA: Diagnosis not present

## 2020-09-28 DIAGNOSIS — J209 Acute bronchitis, unspecified: Secondary | ICD-10-CM | POA: Diagnosis not present

## 2020-10-02 NOTE — Progress Notes (Signed)
Thoracic Location of Tumor / Histology: Metastatic Lung Cancer-  Patient presented in April 2022 with neck and shoulder pain.  PET 09/18/2020: Difficult to determine primary lesion within the RIGHT lung with RIGHT middle lobe hypermetabolic mass and hypermetabolic RIGHT suprahilar mass. Extensive collapse of the RIGHT lower lobe, RIGHT middle lobe and partial collapse of the RIGHT upper lobe. Evidence of pleural metastasis in the RIGHT pleural space.  Extensive of hypermetabolic mediastinal and RIGHT supraclavicular nodal metastasis.  Metastatic nodule to the LEFT lung.  Extensive hypermetabolic metastatic adenopathy in the upper abdomen.  Widespread intensely skeletal metastasis involving the pelvis, spine and ribs.  Biopsies of 10/05/2020 Lymph nodes   Tobacco/Marijuana/Snuff/ETOH use: Current smoker  Past/Anticipated interventions by cardiothoracic surgery, if any:   Past/Anticipated interventions by medical oncology, if any:  Dr. Julien Nordmann 10/03/2020 2:15 pm   Signs/Symptoms  Weight changes, if any: Lost about 35 pounds, intentional weight loss.  Respiratory complaints, if any: Has difficulty with his breathing especially when laying flat.  Hemoptysis, if any: Has non-productive cough, no hemoptysis noted.  Pain issues, if any:  He complains of pain in her neck and lower back.  Pain in his lower back is better when up walking, worse when laying or sitting.  SAFETY ISSUES:  Prior radiation? No  Pacemaker/ICD? No  Possible current pregnancy? n/a  Is the patient on methotrexate? No  Current Complaints / other details:

## 2020-10-03 ENCOUNTER — Inpatient Hospital Stay: Payer: Medicare Other

## 2020-10-03 ENCOUNTER — Inpatient Hospital Stay: Payer: Medicare Other | Attending: Internal Medicine | Admitting: Internal Medicine

## 2020-10-03 ENCOUNTER — Ambulatory Visit
Admission: RE | Admit: 2020-10-03 | Discharge: 2020-10-03 | Disposition: A | Discharge status: Home / Self Care | Payer: Medicare Other | Source: Ambulatory Visit | Attending: Radiation Oncology | Admitting: Radiation Oncology

## 2020-10-03 ENCOUNTER — Other Ambulatory Visit: Payer: Self-pay

## 2020-10-03 ENCOUNTER — Encounter: Payer: Self-pay | Admitting: Radiation Oncology

## 2020-10-03 ENCOUNTER — Encounter: Payer: Self-pay | Admitting: Internal Medicine

## 2020-10-03 DIAGNOSIS — C3490 Malignant neoplasm of unspecified part of unspecified bronchus or lung: Secondary | ICD-10-CM | POA: Diagnosis not present

## 2020-10-03 DIAGNOSIS — F17210 Nicotine dependence, cigarettes, uncomplicated: Secondary | ICD-10-CM | POA: Insufficient documentation

## 2020-10-03 DIAGNOSIS — R634 Abnormal weight loss: Secondary | ICD-10-CM | POA: Insufficient documentation

## 2020-10-03 DIAGNOSIS — R932 Abnormal findings on diagnostic imaging of liver and biliary tract: Secondary | ICD-10-CM

## 2020-10-03 DIAGNOSIS — C3411 Malignant neoplasm of upper lobe, right bronchus or lung: Secondary | ICD-10-CM

## 2020-10-03 DIAGNOSIS — R918 Other nonspecific abnormal finding of lung field: Secondary | ICD-10-CM

## 2020-10-03 DIAGNOSIS — C7951 Secondary malignant neoplasm of bone: Secondary | ICD-10-CM | POA: Diagnosis not present

## 2020-10-03 DIAGNOSIS — R590 Localized enlarged lymph nodes: Secondary | ICD-10-CM | POA: Insufficient documentation

## 2020-10-03 DIAGNOSIS — J984 Other disorders of lung: Secondary | ICD-10-CM | POA: Insufficient documentation

## 2020-10-03 DIAGNOSIS — K7460 Unspecified cirrhosis of liver: Secondary | ICD-10-CM | POA: Insufficient documentation

## 2020-10-03 DIAGNOSIS — Z7951 Long term (current) use of inhaled steroids: Secondary | ICD-10-CM | POA: Insufficient documentation

## 2020-10-03 LAB — CMP (CANCER CENTER ONLY)
ALT: 23 U/L (ref 0–44)
AST: 44 U/L — ABNORMAL HIGH (ref 15–41)
Albumin: 2.7 g/dL — ABNORMAL LOW (ref 3.5–5.0)
Alkaline Phosphatase: 192 U/L — ABNORMAL HIGH (ref 38–126)
Anion gap: 11 (ref 5–15)
BUN: 19 mg/dL (ref 8–23)
CO2: 27 mmol/L (ref 22–32)
Calcium: 9.8 mg/dL (ref 8.9–10.3)
Chloride: 97 mmol/L — ABNORMAL LOW (ref 98–111)
Creatinine: 0.63 mg/dL (ref 0.61–1.24)
GFR, Estimated: >60 mL/min (ref >60)
Glucose, Bld: 108 mg/dL — ABNORMAL HIGH (ref 70–99)
Potassium: 4.1 mmol/L (ref 3.5–5.1)
Sodium: 135 mmol/L (ref 135–145)
Total Bilirubin: 0.6 mg/dL (ref 0.3–1.2)
Total Protein: 7.5 g/dL (ref 6.5–8.1)

## 2020-10-03 LAB — CBC WITH DIFFERENTIAL (CANCER CENTER ONLY)
Abs Immature Granulocytes: 0.1 10*3/uL — ABNORMAL HIGH (ref 0.00–0.07)
Basophils Absolute: 0 10*3/uL (ref 0.0–0.1)
Basophils Relative: 0 %
Eosinophils Absolute: 0 10*3/uL (ref 0.0–0.5)
Eosinophils Relative: 0 %
HCT: 39.8 % (ref 39.0–52.0)
Hemoglobin: 13.5 g/dL (ref 13.0–17.0)
Immature Granulocytes: 1 %
Lymphocytes Relative: 4 %
Lymphs Abs: 0.6 10*3/uL — ABNORMAL LOW (ref 0.7–4.0)
MCH: 30.5 pg (ref 26.0–34.0)
MCHC: 33.9 g/dL (ref 30.0–36.0)
MCV: 90 fL (ref 80.0–100.0)
Monocytes Absolute: 0.7 10*3/uL (ref 0.1–1.0)
Monocytes Relative: 5 %
Neutro Abs: 13.2 10*3/uL — ABNORMAL HIGH (ref 1.7–7.7)
Neutrophils Relative %: 90 %
Platelet Count: 404 10*3/uL — ABNORMAL HIGH (ref 150–400)
RBC: 4.42 MIL/uL (ref 4.22–5.81)
RDW: 13.5 % (ref 11.5–15.5)
WBC Count: 14.7 10*3/uL — ABNORMAL HIGH (ref 4.0–10.5)
nRBC: 0 % (ref 0.0–0.2)

## 2020-10-03 NOTE — Patient Instructions (Signed)
Steps to Quit Smoking Smoking tobacco is the leading cause of preventable death. It can affect almost every organ in the body. Smoking puts you and people around you at risk for many serious, long-lasting (chronic) diseases. Quitting smoking can be hard, but it is one of the best things that you can do for your health. It is never too late to quit. How do I get ready to quit? When you decide to quit smoking, make a plan to help you succeed. Before you quit:  Pick a date to quit. Set a date within the next 2 weeks to give you time to prepare.  Write down the reasons why you are quitting. Keep this list in places where you will see it often.  Tell your family, friends, and co-workers that you are quitting. Their support is important.  Talk with your doctor about the choices that may help you quit.  Find out if your health insurance will pay for these treatments.  Know the people, places, things, and activities that make you want to smoke (triggers). Avoid them. What first steps can I take to quit smoking?  Throw away all cigarettes at home, at work, and in your car.  Throw away the things that you use when you smoke, such as ashtrays and lighters.  Clean your car. Make sure to empty the ashtray.  Clean your home, including curtains and carpets. What can I do to help me quit smoking? Talk with your doctor about taking medicines and seeing a counselor at the same time. You are more likely to succeed when you do both.  If you are pregnant or breastfeeding, talk with your doctor about counseling or other ways to quit smoking. Do not take medicine to help you quit smoking unless your doctor tells you to do so. To quit smoking: Quit right away  Quit smoking totally, instead of slowly cutting back on how much you smoke over a period of time.  Go to counseling. You are more likely to quit if you go to counseling sessions regularly. Take medicine You may take medicines to help you quit. Some  medicines need a prescription, and some you can buy over-the-counter. Some medicines may contain a drug called nicotine to replace the nicotine in cigarettes. Medicines may:  Help you to stop having the desire to smoke (cravings).  Help to stop the problems that come when you stop smoking (withdrawal symptoms). Your doctor may ask you to use:  Nicotine patches, gum, or lozenges.  Nicotine inhalers or sprays.  Non-nicotine medicine that is taken by mouth. Find resources Find resources and other ways to help you quit smoking and remain smoke-free after you quit. These resources are most helpful when you use them often. They include:  Online chats with a counselor.  Phone quitlines.  Printed self-help materials.  Support groups or group counseling.  Text messaging programs.  Mobile phone apps. Use apps on your mobile phone or tablet that can help you stick to your quit plan. There are many free apps for mobile phones and tablets as well as websites. Examples include Quit Guide from the CDC and smokefree.gov   What things can I do to make it easier to quit?  Talk to your family and friends. Ask them to support and encourage you.  Call a phone quitline (1-800-QUIT-NOW), reach out to support groups, or work with a counselor.  Ask people who smoke to not smoke around you.  Avoid places that make you want to smoke,   such as: ? Bars. ? Parties. ? Smoke-break areas at work.  Spend time with people who do not smoke.  Lower the stress in your life. Stress can make you want to smoke. Try these things to help your stress: ? Getting regular exercise. ? Doing deep-breathing exercises. ? Doing yoga. ? Meditating. ? Doing a body scan. To do this, close your eyes, focus on one area of your body at a time from head to toe. Notice which parts of your body are tense. Try to relax the muscles in those areas.   How will I feel when I quit smoking? Day 1 to 3 weeks Within the first 24 hours,  you may start to have some problems that come from quitting tobacco. These problems are very bad 2-3 days after you quit, but they do not often last for more than 2-3 weeks. You may get these symptoms:  Mood swings.  Feeling restless, nervous, angry, or annoyed.  Trouble concentrating.  Dizziness.  Strong desire for high-sugar foods and nicotine.  Weight gain.  Trouble pooping (constipation).  Feeling like you may vomit (nausea).  Coughing or a sore throat.  Changes in how the medicines that you take for other issues work in your body.  Depression.  Trouble sleeping (insomnia). Week 3 and afterward After the first 2-3 weeks of quitting, you may start to notice more positive results, such as:  Better sense of smell and taste.  Less coughing and sore throat.  Slower heart rate.  Lower blood pressure.  Clearer skin.  Better breathing.  Fewer sick days. Quitting smoking can be hard. Do not give up if you fail the first time. Some people need to try a few times before they succeed. Do your best to stick to your quit plan, and talk with your doctor if you have any questions or concerns. Summary  Smoking tobacco is the leading cause of preventable death. Quitting smoking can be hard, but it is one of the best things that you can do for your health.  When you decide to quit smoking, make a plan to help you succeed.  Quit smoking right away, not slowly over a period of time.  When you start quitting, seek help from your doctor, family, or friends. This information is not intended to replace advice given to you by your health care provider. Make sure you discuss any questions you have with your health care provider. Document Revised: 01/08/2019 Document Reviewed: 07/04/2018 Elsevier Patient Education  2021 Elsevier Inc.  

## 2020-10-03 NOTE — Progress Notes (Signed)
Radiation Oncology         [phone] ________________________________  Name: Wesley Hunt        MRN: [id number]  Date of Service: 10/03/2020 DOB: 03/01/1956  JJ:OACZ, Costella Hatcher, MD  Curt Bears, MD     REFERRING PHYSICIAN: Curt Bears, MD   DIAGNOSIS: The encounter diagnosis was Malignant neoplasm of upper lobe of right lung [facility]).   HISTORY OF PRESENT ILLNESS: Wesley Hunt is a 65 y.o. male seen at the request of Dr. Julien Nordmann for a newly noted metastatic carcinoma likely lung primary.  The patient originally presented in April 2022 with neck and shoulder pain and x-rays were performed finally a CT of the chest on 08/14/2020 showing concerns for metastatic lung cancer as well as unintended 50 pound weight loss.  He is a smoker, and started having a cough as well without any hemoptysis.  He underwent pet imaging on 09/18/2020 which shows activity in the right lung involving the right middle lobe, right suprahilar region and right lower lobe with extensive collapse and right middle lobe and partial collapse of the right upper lobe, pleural-based disease was also seen in the right lung region and extensive hypermetabolic mediastinal and supraclavicular disease was seen a lesion in the left lung was hypermetabolic and widespread skeletal disease involving the pelvis spine and ribs were also noted.  Cirrhotic appearing changes of the liver were seen.  He is scheduled to undergo an ultrasound-guided biopsy of right supraclavicular adenopathy on 10/05/2020 and is seen today to discuss palliative radiotherapy.     PREVIOUS RADIATION THERAPY: No   PAST MEDICAL HISTORY:  Past Medical History:  Diagnosis Date  . Dupuytren's contracture        PAST SURGICAL HISTORY: Past Surgical History:  Procedure Laterality Date  . CLEFT PALATE REPAIR    . FASCIECTOMY Right 10/12/2019   Procedure: FASCIECTOMY RIGHT RING AND RIGHT SMALL FINGER;  Surgeon: Daryll Brod, MD;  Location: [facility];  Service: Orthopedics;  Laterality: Right;     FAMILY HISTORY: History reviewed. No pertinent family history.   SOCIAL HISTORY:  reports that he has been smoking cigarettes. He has been smoking about 0.50 packs per day. He has never used smokeless tobacco. He reports that he does not drink alcohol and does not use drugs. The patient is single and lives in [place]. He is accompanied by his sister. He is retired from a Orthoptist and enjoys golf.   ALLERGIES: Patient has no known allergies.   MEDICATIONS:  Current Outpatient Medications  Medication Sig Dispense Refill  . fentaNYL (DURAGESIC) 50 MCG/HR Place 1 patch onto the skin every 3 (three) days.    Marland Kitchen levofloxacin (LEVAQUIN) 500 MG tablet Take 500 mg by mouth daily.    . predniSONE (DELTASONE) 10 MG tablet Take 10 mg by mouth 2 (two) times daily.    . polyethylene glycol-electrolytes (TRILYTE) 420 g solution Take 4,000 mLs by mouth as directed. (Patient not taking: No sig reported) 4000 mL 0   No current facility-administered medications for this encounter.     REVIEW OF SYSTEMS: On review of systems, the patient reports that he is having trouble with shortness of breath with most activities and orthopnea as a result for several months. He has lost about 50 pounds, 15 of which are unintentionally in the last 6 months. He has had a nonproductive cough without hemoptysis. He has pain in his neck left and left shoulder as well as in his mid  low back that does not radiate or lateralize. No other complaints are verbalized.     PHYSICAL EXAM:  Wt Readings from Last 3 Encounters:  10/03/20 146 lb 9.6 oz (66.5 kg)  09/18/20 144 lb (65.3 kg)  08/25/20 144 lb 12.8 oz (65.7 kg)   Temp Readings from Last 3 Encounters:  10/03/20 98.2 F (36.8 C) (Tympanic)  08/25/20 98.2 F (36.8 C) (Temporal)  10/12/19 97.7 F (36.5 C)   BP Readings from Last 3 Encounters:  10/03/20 (!) 174/74  08/25/20 (!) 146/70  10/12/19 (!) 162/81    Pulse Readings from Last 3 Encounters:  10/03/20 80  08/25/20 70  10/12/19 69   In general this is a well appearing caucasian male in no acute distress. He's alert and oriented x4 and appropriate throughout the examination. Cardiopulmonary assessment is negative for acute distress and he exhibits normal effort.    ECOG = 0  0 - Asymptomatic (Fully active, able to carry on all predisease activities without restriction)  1 - Symptomatic but completely ambulatory (Restricted in physically strenuous activity but ambulatory and able to carry out work of a light or sedentary nature. For example, light housework, office work)  2 - Symptomatic, <50% in bed during the day (Ambulatory and capable of all self care but unable to carry out any work activities. Up and about more than 50% of waking hours)  3 - Symptomatic, >50% in bed, but not bedbound (Capable of only limited self-care, confined to bed or chair 50% or more of waking hours)  4 - Bedbound (Completely disabled. Cannot carry on any self-care. Totally confined to bed or chair)  5 - Death   Eustace Pen MM, Creech RH, Tormey DC, et al. [phone]). "Toxicity and response criteria of the [facility]". [place] Oncol. 5 (6): 649-55    LABORATORY DATA:  Lab Results  Component Value Date   WBC 14.7 (H) 10/03/2020   HGB 13.5 10/03/2020   HCT 39.8 10/03/2020   MCV 90.0 10/03/2020   PLT 404 (H) 10/03/2020   Lab Results  Component Value Date   NA 135 10/03/2020   K 4.1 10/03/2020   CL 97 (L) 10/03/2020   CO2 27 10/03/2020   Lab Results  Component Value Date   ALT 23 10/03/2020   AST 44 (H) 10/03/2020   ALKPHOS 192 (H) 10/03/2020   BILITOT 0.6 10/03/2020      RADIOGRAPHY: NM PET Image Initial (PI) Skull Base To Thigh  Result Date: 09/20/2020 CLINICAL DATA:  Initial treatment strategy for lung nodule. 65 year old MALE EXAM: NUCLEAR MEDICINE PET SKULL BASE TO THIGH TECHNIQUE: 8.6 mCi F-18 FDG was injected  intravenously. Full-ring PET imaging was performed from the skull base to thigh after the radiotracer. CT data was obtained and used for attenuation correction and anatomic localization. Fasting blood glucose: 91 mg/dl COMPARISON:  Chest CT 08/14/2020. FINDINGS: Mediastinal blood pool activity: SUV max 1.4 Liver activity: SUV max NA NECK: Cluster of intensely hypermetabolic RIGHT supraclavicular nodes measuring 3.2 cm in mass with SUV max equal 9.1 (image 60). Incidental CT findings: none CHEST: Rim of intense hypermetabolic activity within the collapsed RIGHT middle lobe with SUV max equal 6.9. Hypermetabolic mass in the RIGHT suprahilar location with SUV max equal 10.1. There is partial collapse of the RIGHT upper lobe. Large RIGHT pleural effusion. Hypermetabolic nodularity in the medial pleural space of the RIGHT lower lobe SUV max equal 8.7 on image 128. Intense hypermetabolic RIGHT paratracheal adenopathy, subcarinal adenopathy and  LEFT peribronchial adenopathy. Example LEFT peribronchial thickening SUV max equal 10.9. RIGHT tracheal nodes with SUV max equal 11.5 in measuring 2.5 cm in thickness. Hypermetabolic nodule in the LEFT upper lobe measuring 12 mm with SUV max equal 5.8. Incidental CT findings: none ABDOMEN/PELVIS: Extensive intense hypermetabolic upper abdominal adenopathy. Large node ventral to the pancreas in the gastrohepatic ligament measures 2.1 cm in thickness (image 171) with SUV max equal 14.4. Hypermetabolic retroperitoneal para aortic nodes at the level the renal veins with SUV max equal 12.6 on image 190. Hypermetabolic adenopathy extends to a RIGHT external iliac lymph node with SUV max equal 11. No focal activity in the liver to suggest metastasis. Liver has a nodular contour. No adrenal metastasis Incidental CT findings: none SKELETON: Widespread intensely hypermetabolic skeletal metastasis. Example sclerotic lesion the posterior RIGHT sacrum with SUV max equal 9.2. Intensely  hypermetabolic lesion the posterior LEFT iliac bone with SUV max equal 10.9 and measuring 2 cm. These lesions are subtly sclerotic on the CT portion exam. Broad lesion involving the L4 vertebral body involves the near entire vertebral body SUV max equal 42.5 Hypermetabolic rib lesions additionally. Incidental CT findings: none IMPRESSION: 1. Difficult to determine primary lesion within the RIGHT lung with RIGHT middle lobe hypermetabolic mass and hypermetabolic RIGHT suprahilar mass. Extensive collapse of the RIGHT lower lobe, RIGHT middle lobe and partial collapse of the RIGHT upper lobe. Evidence of pleural metastasis in the RIGHT pleural space. 2. Extensive of hypermetabolic mediastinal and RIGHT supraclavicular nodal metastasis. 3. Metastatic nodule to the LEFT lung. 4. Extensive hypermetabolic metastatic adenopathy in the upper abdomen. 5. Widespread intensely skeletal metastasis involving the pelvis, spine and ribs. 6. Cirrhotic liver. Electronically Signed   By: Suzy Bouchard M.D.   On: 09/20/2020 10:33       IMPRESSION/PLAN: 1. Probable Stage IV, NSCLC, of the right upper lobe with contralateral lung and bone disease. Dr. Lisbeth Renshaw discusses the pathology findings and reviews the nature of metastatic lung cancer. We will follow up with the results of his brain MRI. He will go for biopsy on Thursday to confirm our suspicions as well. He would be a candidate for palliative radiotherapy to the chest to reduce risks of pneumonia and reverse lung collapse by his tumor as well as to palliate pain in his lumbar spine. We discussed the risks, benefits, short, and long term effects of radiotherapy, as well as the palliative intent, and the patient is interested in proceeding. Dr. Lisbeth Renshaw discusses the delivery and logistics of radiotherapy and anticipates a course of 2 weeks of radiotherapy. Written consent is obtained and placed in the chart, a copy was provided to the patient. He will simulate on Thursday this  week and start on Tuesday provided pathology confirms his diagnosis.  He will follow up with Dr. Julien Nordmann as well regarding systemic therapy.  In a visit lasting 60 minutes, greater than 50% of the time was spent face to face discussing the patient's condition, in preparation for the discussion, and coordinating the patient's care.  The above documentation reflects my direct findings during this shared patient visit. Please see the separate note by Dr. Lisbeth Renshaw on this date for the remainder of the patient's plan of care.    Carola Rhine, [facility]   **Disclaimer: This note was dictated with voice recognition software. Similar sounding words can inadvertently be transcribed and this note may contain transcription errors which may not have been corrected upon publication of note.**

## 2020-10-03 NOTE — Progress Notes (Signed)
[place] [phone]   [phone]  CONSULT NOTE  REFERRING PHYSICIAN: Dr. Chesley Mires  REASON FOR CONSULTATION:  65 years old white male with highly suspicious metastatic lung cancer  HPI Wesley Hunt is a 65 y.o. male with past medical history significant for Dupuytren's contraction, cleft palate repair, liver cirrhosis, chronic kidney disease as well as long history of his smoking and drug abuse.  The patient mentioned that 6 to 7 weeks ago he has been complaining of pain in the neck area as well as cough, shortness of breath and right shoulder pain.  He was seen by his primary care physician in [facility] and chest x-ray on 08/03/2020 showed dense right basilar consolidation suspicious for pneumonia with small right pleural effusion.  This was followed by CT scan of the chest on 08/14/2020 and it showed complete collapse of the right middle lobe identified with truncation of the right middle lobe bronchi.  There was abnormal soft tissue density along the medial aspect of the right upper lobe/mediastinum and a small right pleural effusion and right basilar atelectasis.  There was also scattered noncalcified pulmonary nodules within both lungs with index nodules including 0.6 cm right upper lobe nodule, 0.7 cm left upper lobe nodule, 0.8 cm right upper lobe nodule.  There was also multiple enlarged mediastinal and right hilar lymph nodes identified including 3.0 cm subcarinal node, 2.5 cm right paratracheal node, 3.2 cm right hilar node/mass and a 1.1 cm left prevascular node.  The patient was referred to Dr. Halford Chessman and a PET scan was performed on Sep 18, 2020 and it showed difficult to determine primary lesion within the right lung with the right middle lobe hypermetabolic mass and hypermetabolic right suprahilar mass.  There was extensive collapse of the right lower lobe, right middle lobe and partial collapse of the right upper lobe.  There was evidence  of pleural metastasis in the right pleural space.  There was extensive hypermetabolic mediastinal and right supraclavicular nodal metastasis in addition to metastatic nodule to the left lung.  There was also extensive hypermetabolic metastatic adenopathy in the upper abdomen and widespread intensely skeletal metastasis involving the pelvis, spine and ribs.  The patient also has cirrhotic liver.  The patient is a scheduled for ultrasound-guided core biopsy of the right supraclavicular lymph node on 10/05/2020.  He was referred to me today for evaluation and recommendation regarding his condition. When seen today the patient mentions that he is hurting everywhere.  He also has cough and shortness of breath especially with exertion.  He lost around 50-60 pounds in the last several months and he was using some online weight loss supplements.  He denied having any headache but he has blindness in the eyes and he was supposed to have a cataract surgery but was not done.  The patient has no nausea, vomiting, diarrhea or constipation. Family history significant for mother with COPD and died at age 44.  Father died from motor vehicle accident.  Maternal grandfather had lung cancer and sister had ovarian cancer. The patient is single and has no children.  He used to work as a Curator.  He was accompanied today by his Sister Wesley Hunt.  He has a history of smoking 1 pack/day for around 50 years and quit 2 weeks ago.  He has history of alcohol as well as drug abuse.  He received his pain medication from his primary care physician.  HPI  Past Medical History:  Diagnosis Date  . Dupuytren's contracture     Past Surgical History:  Procedure Laterality Date  . CLEFT PALATE REPAIR    . FASCIECTOMY Right 10/12/2019   Procedure: FASCIECTOMY RIGHT RING AND RIGHT SMALL FINGER;  Surgeon: Daryll Brod, MD;  Location: [place];  Service: Orthopedics;  Laterality: Right;    No family history on file.  Social  History Social History   Tobacco Use  . Smoking status: Current Every Day Smoker    Packs/day: 0.50    Types: Cigarettes  . Smokeless tobacco: Never Used  . Tobacco comment: smokes 5 cigarettes per day 08/25/20  Vaping Use  . Vaping Use: Never used  Substance Use Topics  . Alcohol use: Never  . Drug use: Never    No Known Allergies  Current Outpatient Medications  Medication Sig Dispense Refill  . fentaNYL (DURAGESIC) 50 MCG/HR Place 1 patch onto the skin every 3 (three) days.    Marland Kitchen levofloxacin (LEVAQUIN) 500 MG tablet Take 500 mg by mouth daily.    . polyethylene glycol-electrolytes (TRILYTE) 420 g solution Take 4,000 mLs by mouth as directed. 4000 mL 0  . predniSONE (DELTASONE) 10 MG tablet Take 10 mg by mouth 2 (two) times daily.     No current facility-administered medications for this visit.    Review of Systems  Constitutional: positive for anorexia, fatigue and weight loss Eyes: positive for visual disturbance Ears, nose, mouth, throat, and face: negative Respiratory: positive for cough, dyspnea on exertion and pleurisy/chest pain Cardiovascular: negative Gastrointestinal: negative Genitourinary:negative Integument/breast: negative Hematologic/lymphatic: negative Musculoskeletal:positive for back pain, bone pain and muscle weakness Neurological: negative Behavioral/Psych: negative Endocrine: negative Allergic/Immunologic: negative  Physical Exam  JIR:CVELF, healthy, no distress, well nourished and well developed SKIN: skin color, texture, turgor are normal, no rashes or significant lesions HEAD: Normocephalic, No masses, lesions, tenderness or abnormalities EYES: normal, PERRLA, Conjunctiva are pink and non-injected EARS: External ears normal, Canals clear OROPHARYNX:no exudate, no erythema and lips, buccal mucosa, and tongue normal  NECK: supple, palpable right supraclavicular adenopathy, no JVD LYMPH:  enlarged lymph nodes palpated in the right  supraclavicular area LUNGS: clear to auscultation , and palpation HEART: regular rate & rhythm, no murmurs and no gallops ABDOMEN:abdomen soft, non-tender, normal bowel sounds and no masses or organomegaly BACK: No CVA tenderness, Range of motion is normal EXTREMITIES:no joint deformities, effusion, or inflammation, no edema  NEURO: alert & oriented x 3 with fluent speech, no focal motor/sensory deficits  PERFORMANCE STATUS: ECOG 1  LABORATORY DATA: Lab Results  Component Value Date   WBC 14.7 (H) 10/03/2020   HGB 13.5 10/03/2020   HCT 39.8 10/03/2020   MCV 90.0 10/03/2020   PLT 404 (H) 10/03/2020      Chemistry   No results found for: NA, K, CL, CO2, BUN, CREATININE, GLU No results found for: CALCIUM, ALKPHOS, AST, ALT, BILITOT     RADIOGRAPHIC STUDIES: NM PET Image Initial (PI) Skull Base To Thigh  Result Date: 09/20/2020 CLINICAL DATA:  Initial treatment strategy for lung nodule. 65 year old MALE EXAM: NUCLEAR MEDICINE PET SKULL BASE TO THIGH TECHNIQUE: 8.6 mCi F-18 FDG was injected intravenously. Full-ring PET imaging was performed from the skull base to thigh after the radiotracer. CT data was obtained and used for attenuation correction and anatomic localization. Fasting blood glucose: 91 mg/dl COMPARISON:  Chest CT 08/14/2020. FINDINGS: Mediastinal blood pool activity: SUV max 1.4 Liver activity: SUV max NA NECK: Cluster of intensely hypermetabolic RIGHT supraclavicular nodes measuring 3.2 cm  in mass with SUV max equal 9.1 (image 60). Incidental CT findings: none CHEST: Rim of intense hypermetabolic activity within the collapsed RIGHT middle lobe with SUV max equal 6.9. Hypermetabolic mass in the RIGHT suprahilar location with SUV max equal 10.1. There is partial collapse of the RIGHT upper lobe. Large RIGHT pleural effusion. Hypermetabolic nodularity in the medial pleural space of the RIGHT lower lobe SUV max equal 8.7 on image 128. Intense hypermetabolic RIGHT paratracheal  adenopathy, subcarinal adenopathy and LEFT peribronchial adenopathy. Example LEFT peribronchial thickening SUV max equal 10.9. RIGHT tracheal nodes with SUV max equal 11.5 in measuring 2.5 cm in thickness. Hypermetabolic nodule in the LEFT upper lobe measuring 12 mm with SUV max equal 5.8. Incidental CT findings: none ABDOMEN/PELVIS: Extensive intense hypermetabolic upper abdominal adenopathy. Large node ventral to the pancreas in the gastrohepatic ligament measures 2.1 cm in thickness (image 171) with SUV max equal 14.4. Hypermetabolic retroperitoneal para aortic nodes at the level the renal veins with SUV max equal 12.6 on image 190. Hypermetabolic adenopathy extends to a RIGHT external iliac lymph node with SUV max equal 11. No focal activity in the liver to suggest metastasis. Liver has a nodular contour. No adrenal metastasis Incidental CT findings: none SKELETON: Widespread intensely hypermetabolic skeletal metastasis. Example sclerotic lesion the posterior RIGHT sacrum with SUV max equal 9.2. Intensely hypermetabolic lesion the posterior LEFT iliac bone with SUV max equal 10.9 and measuring 2 cm. These lesions are subtly sclerotic on the CT portion exam. Broad lesion involving the L4 vertebral body involves the near entire vertebral body SUV max equal 82.9 Hypermetabolic rib lesions additionally. Incidental CT findings: none IMPRESSION: 1. Difficult to determine primary lesion within the RIGHT lung with RIGHT middle lobe hypermetabolic mass and hypermetabolic RIGHT suprahilar mass. Extensive collapse of the RIGHT lower lobe, RIGHT middle lobe and partial collapse of the RIGHT upper lobe. Evidence of pleural metastasis in the RIGHT pleural space. 2. Extensive of hypermetabolic mediastinal and RIGHT supraclavicular nodal metastasis. 3. Metastatic nodule to the LEFT lung. 4. Extensive hypermetabolic metastatic adenopathy in the upper abdomen. 5. Widespread intensely skeletal metastasis involving the pelvis,  spine and ribs. 6. Cirrhotic liver. Electronically Signed   By: Suzy Bouchard M.D.   On: 09/20/2020 10:33    ASSESSMENT: This is a very pleasant 65 years old white male with history of tobacco, alcohol and drug abuse presented with highly suspicious metastatic lung cancer involving the right middle lobe, right lower lobe, right suprahilar mass as well as partial collapse of the right upper lobe with pleural metastasis in the right pleural space as well as extensive mediastinal and right supraclavicular metastasis in addition to metastatic nodule in the left lung and extensive metastatic adenopathy in the upper abdomen and widespread skeletal metastasis.   PLAN: I had a lengthy discussion with the patient and his sister today about his current condition and further investigation to confirm his diagnosis as well as prognosis and treatment options. I recommended for the patient to keep his appointment for the ultrasound-guided core biopsy of the right supraclavicular lymphadenopathy for confirmation of the tissue diagnosis. I will also complete the staging work-up by ordering MRI of the brain to rule out brain metastasis. The patient has an appointment later today with radiation oncology for consideration of palliative radiotherapy to the painful bone lesions. For pain management he will continue with his current chronic pain medication by his primary care physician at this point. I will see the patient back for follow-up visit in around  10 days but unfortunately has very extensive disease and not sure if the patient will be candidate for future therapy based on his condition and performance status.  He may be a good candidate for palliative care and hospice at the final pathology is consistent with lung cancer. The patient and his sister agreed to the current plan. He was advised to call immediately if he has any other concerning symptoms in the interval. The patient voices understanding of current  disease status and treatment options and is in agreement with the current care plan.  All questions were answered. The patient knows to call the clinic with any problems, questions or concerns. We can certainly see the patient much sooner if necessary.  Thank you so much for allowing me to participate in the care of Wesley Hunt. I will continue to follow up the patient with you and assist in his care.  The total time spent in the appointment was 60 minutes.  Disclaimer: This note was dictated with voice recognition software. Similar sounding words can inadvertently be transcribed and may not be corrected upon review.   Eilleen Kempf October 03, 2020, 2:17 PM

## 2020-10-04 ENCOUNTER — Encounter: Payer: Self-pay | Admitting: *Deleted

## 2020-10-04 ENCOUNTER — Other Ambulatory Visit: Payer: Self-pay | Admitting: Student

## 2020-10-04 NOTE — Progress Notes (Signed)
I spoke with Mr. Wesley Hunt and his sister yesterday at clinic. He is a new patient of Dr. Julien Nordmann with extensive disease but no dx as of today. His bx is scheduled for 10/05/20 and patient is aware.  Per Dr. Julien Nordmann, patient needs work up with MRI brain. I reached out to authorization coordinator to expedite scan auth in hopes to get this scheduled soon. Patient and sister verbalized understanding of plan of care.

## 2020-10-05 ENCOUNTER — Ambulatory Visit (HOSPITAL_COMMUNITY)
Admission: RE | Admit: 2020-10-05 | Discharge: 2020-10-05 | Disposition: A | Discharge status: Home / Self Care | Payer: Medicare Other | Source: Ambulatory Visit | Attending: Pulmonary Disease | Admitting: Pulmonary Disease

## 2020-10-05 ENCOUNTER — Emergency Department (HOSPITAL_COMMUNITY): Payer: Medicare Other

## 2020-10-05 ENCOUNTER — Inpatient Hospital Stay (HOSPITAL_COMMUNITY): Payer: Medicare Other

## 2020-10-05 ENCOUNTER — Ambulatory Visit (HOSPITAL_COMMUNITY)
Admission: RE | Admit: 2020-10-05 | Discharge: 2020-10-05 | Disposition: A | Discharge status: Home / Self Care | Payer: Medicare Other | Source: Ambulatory Visit | Attending: Physician Assistant | Admitting: Physician Assistant

## 2020-10-05 ENCOUNTER — Other Ambulatory Visit: Payer: Self-pay

## 2020-10-05 ENCOUNTER — Inpatient Hospital Stay (HOSPITAL_COMMUNITY)
Admission: EM | Admit: 2020-10-05 | Discharge: 2020-10-10 | DRG: 180 | Disposition: A | Discharge status: Hospice | Payer: Medicare Other | Attending: Family Medicine | Admitting: Family Medicine

## 2020-10-05 ENCOUNTER — Ambulatory Visit
Admission: RE | Admit: 2020-10-05 | Discharge: 2020-10-05 | Disposition: A | Discharge status: Home / Self Care | Payer: Medicare Other | Source: Ambulatory Visit | Attending: Radiation Oncology | Admitting: Radiation Oncology

## 2020-10-05 DIAGNOSIS — I350 Nonrheumatic aortic (valve) stenosis: Secondary | ICD-10-CM | POA: Diagnosis not present

## 2020-10-05 DIAGNOSIS — C3411 Malignant neoplasm of upper lobe, right bronchus or lung: Secondary | ICD-10-CM | POA: Diagnosis not present

## 2020-10-05 DIAGNOSIS — K219 Gastro-esophageal reflux disease without esophagitis: Secondary | ICD-10-CM | POA: Diagnosis not present

## 2020-10-05 DIAGNOSIS — C3490 Malignant neoplasm of unspecified part of unspecified bronchus or lung: Secondary | ICD-10-CM | POA: Diagnosis not present

## 2020-10-05 DIAGNOSIS — C799 Secondary malignant neoplasm of unspecified site: Secondary | ICD-10-CM | POA: Insufficient documentation

## 2020-10-05 DIAGNOSIS — R471 Dysarthria and anarthria: Secondary | ICD-10-CM | POA: Diagnosis not present

## 2020-10-05 DIAGNOSIS — Z515 Encounter for palliative care: Secondary | ICD-10-CM

## 2020-10-05 DIAGNOSIS — K5900 Constipation, unspecified: Secondary | ICD-10-CM | POA: Diagnosis present

## 2020-10-05 DIAGNOSIS — J948 Other specified pleural conditions: Secondary | ICD-10-CM | POA: Diagnosis not present

## 2020-10-05 DIAGNOSIS — Z79899 Other long term (current) drug therapy: Secondary | ICD-10-CM

## 2020-10-05 DIAGNOSIS — C7951 Secondary malignant neoplasm of bone: Secondary | ICD-10-CM | POA: Diagnosis present

## 2020-10-05 DIAGNOSIS — R591 Generalized enlarged lymph nodes: Secondary | ICD-10-CM | POA: Diagnosis present

## 2020-10-05 DIAGNOSIS — J9601 Acute respiratory failure with hypoxia: Secondary | ICD-10-CM | POA: Diagnosis not present

## 2020-10-05 DIAGNOSIS — J9620 Acute and chronic respiratory failure, unspecified whether with hypoxia or hypercapnia: Secondary | ICD-10-CM | POA: Diagnosis not present

## 2020-10-05 DIAGNOSIS — Z7401 Bed confinement status: Secondary | ICD-10-CM | POA: Diagnosis not present

## 2020-10-05 DIAGNOSIS — Z20822 Contact with and (suspected) exposure to covid-19: Secondary | ICD-10-CM | POA: Diagnosis not present

## 2020-10-05 DIAGNOSIS — Z7952 Long term (current) use of systemic steroids: Secondary | ICD-10-CM

## 2020-10-05 DIAGNOSIS — I480 Paroxysmal atrial fibrillation: Secondary | ICD-10-CM | POA: Diagnosis not present

## 2020-10-05 DIAGNOSIS — Z7189 Other specified counseling: Secondary | ICD-10-CM | POA: Diagnosis not present

## 2020-10-05 DIAGNOSIS — K7460 Unspecified cirrhosis of liver: Secondary | ICD-10-CM | POA: Diagnosis not present

## 2020-10-05 DIAGNOSIS — J9811 Atelectasis: Secondary | ICD-10-CM | POA: Diagnosis present

## 2020-10-05 DIAGNOSIS — J90 Pleural effusion, not elsewhere classified: Secondary | ICD-10-CM | POA: Diagnosis not present

## 2020-10-05 DIAGNOSIS — Z66 Do not resuscitate: Secondary | ICD-10-CM | POA: Diagnosis not present

## 2020-10-05 DIAGNOSIS — J449 Chronic obstructive pulmonary disease, unspecified: Secondary | ICD-10-CM | POA: Diagnosis present

## 2020-10-05 DIAGNOSIS — R64 Cachexia: Secondary | ICD-10-CM | POA: Diagnosis present

## 2020-10-05 DIAGNOSIS — Z79891 Long term (current) use of opiate analgesic: Secondary | ICD-10-CM

## 2020-10-05 DIAGNOSIS — R0602 Shortness of breath: Secondary | ICD-10-CM | POA: Insufficient documentation

## 2020-10-05 DIAGNOSIS — G893 Neoplasm related pain (acute) (chronic): Secondary | ICD-10-CM | POA: Diagnosis not present

## 2020-10-05 DIAGNOSIS — Z8611 Personal history of tuberculosis: Secondary | ICD-10-CM

## 2020-10-05 DIAGNOSIS — R5381 Other malaise: Secondary | ICD-10-CM | POA: Diagnosis not present

## 2020-10-05 DIAGNOSIS — F17210 Nicotine dependence, cigarettes, uncomplicated: Secondary | ICD-10-CM | POA: Diagnosis present

## 2020-10-05 DIAGNOSIS — Z6822 Body mass index (BMI) 22.0-22.9, adult: Secondary | ICD-10-CM

## 2020-10-05 DIAGNOSIS — Z48813 Encounter for surgical aftercare following surgery on the respiratory system: Secondary | ICD-10-CM | POA: Diagnosis not present

## 2020-10-05 DIAGNOSIS — I5030 Unspecified diastolic (congestive) heart failure: Secondary | ICD-10-CM | POA: Diagnosis not present

## 2020-10-05 DIAGNOSIS — Z9889 Other specified postprocedural states: Secondary | ICD-10-CM | POA: Diagnosis not present

## 2020-10-05 DIAGNOSIS — Z743 Need for continuous supervision: Secondary | ICD-10-CM | POA: Diagnosis not present

## 2020-10-05 HISTORY — PX: IR THORACENTESIS ASP PLEURAL SPACE W/IMG GUIDE: IMG5380

## 2020-10-05 LAB — ABO/RH: ABO/RH(D): O POS

## 2020-10-05 LAB — CBC WITH DIFFERENTIAL/PLATELET
Abs Immature Granulocytes: 0.09 10*3/uL — ABNORMAL HIGH (ref 0.00–0.07)
Basophils Absolute: 0 10*3/uL (ref 0.0–0.1)
Basophils Relative: 0 %
Eosinophils Absolute: 0 10*3/uL (ref 0.0–0.5)
Eosinophils Relative: 0 %
HCT: 40.5 % (ref 39.0–52.0)
Hemoglobin: 13.4 g/dL (ref 13.0–17.0)
Immature Granulocytes: 1 %
Lymphocytes Relative: 3 %
Lymphs Abs: 0.6 10*3/uL — ABNORMAL LOW (ref 0.7–4.0)
MCH: 30.5 pg (ref 26.0–34.0)
MCHC: 33.1 g/dL (ref 30.0–36.0)
MCV: 92.3 fL (ref 80.0–100.0)
Monocytes Absolute: 0.8 10*3/uL (ref 0.1–1.0)
Monocytes Relative: 5 %
Neutro Abs: 15.1 10*3/uL — ABNORMAL HIGH (ref 1.7–7.7)
Neutrophils Relative %: 91 %
Platelets: 397 10*3/uL (ref 150–400)
RBC: 4.39 MIL/uL (ref 4.22–5.81)
RDW: 13.8 % (ref 11.5–15.5)
WBC: 16.6 10*3/uL — ABNORMAL HIGH (ref 4.0–10.5)
nRBC: 0 % (ref 0.0–0.2)

## 2020-10-05 LAB — BODY FLUID CELL COUNT WITH DIFFERENTIAL
Eos, Fluid: 0 %
Lymphs, Fluid: 70 %
Monocyte-Macrophage-Serous Fluid: 12 % — ABNORMAL LOW (ref 50–90)
Neutrophil Count, Fluid: 18 % (ref 0–25)
Total Nucleated Cell Count, Fluid: 595 cu mm (ref 0–1000)

## 2020-10-05 LAB — RESP PANEL BY RT-PCR (FLU A&B, COVID) ARPGX2
Influenza A by PCR: NEGATIVE
Influenza B by PCR: NEGATIVE
SARS Coronavirus 2 by RT PCR: NEGATIVE

## 2020-10-05 LAB — COMPREHENSIVE METABOLIC PANEL
ALT: 25 U/L (ref 0–44)
AST: 38 U/L (ref 15–41)
Albumin: 2.5 g/dL — ABNORMAL LOW (ref 3.5–5.0)
Alkaline Phosphatase: 150 U/L — ABNORMAL HIGH (ref 38–126)
Anion gap: 10 (ref 5–15)
BUN: 16 mg/dL (ref 8–23)
CO2: 27 mmol/L (ref 22–32)
Calcium: 9.2 mg/dL (ref 8.9–10.3)
Chloride: 96 mmol/L — ABNORMAL LOW (ref 98–111)
Creatinine, Ser: 0.49 mg/dL — ABNORMAL LOW (ref 0.61–1.24)
GFR, Estimated: >60 mL/min (ref >60)
Glucose, Bld: 100 mg/dL — ABNORMAL HIGH (ref 70–99)
Potassium: 4 mmol/L (ref 3.5–5.1)
Sodium: 133 mmol/L — ABNORMAL LOW (ref 135–145)
Total Bilirubin: 0.7 mg/dL (ref 0.3–1.2)
Total Protein: 6.8 g/dL (ref 6.5–8.1)

## 2020-10-05 LAB — LACTIC ACID, PLASMA: Lactic Acid, Venous: 1.4 mmol/L (ref 0.5–1.9)

## 2020-10-05 LAB — LACTATE DEHYDROGENASE, PLEURAL OR PERITONEAL FLUID: LD, Fluid: 395 U/L — ABNORMAL HIGH (ref 3–23)

## 2020-10-05 LAB — BRAIN NATRIURETIC PEPTIDE: B Natriuretic Peptide: 151.4 pg/mL — ABNORMAL HIGH (ref 0.0–100.0)

## 2020-10-05 LAB — TYPE AND SCREEN
ABO/RH(D): O POS
Antibody Screen: NEGATIVE

## 2020-10-05 LAB — PROTEIN, PLEURAL OR PERITONEAL FLUID: Total protein, fluid: 4 g/dL

## 2020-10-05 LAB — TROPONIN I (HIGH SENSITIVITY)
Troponin I (High Sensitivity): 10 ng/L (ref <18)
Troponin I (High Sensitivity): 13 ng/L (ref <18)

## 2020-10-05 LAB — GLUCOSE, PLEURAL OR PERITONEAL FLUID: Glucose, Fluid: 87 mg/dL

## 2020-10-05 LAB — GRAM STAIN

## 2020-10-05 LAB — ALBUMIN, PLEURAL OR PERITONEAL FLUID: Albumin, Fluid: 1.9 g/dL

## 2020-10-05 LAB — HIV ANTIBODY (ROUTINE TESTING W REFLEX): HIV Screen 4th Generation wRfx: NONREACTIVE

## 2020-10-05 MED ORDER — SODIUM CHLORIDE 0.9 % IV SOLN: INTRAVENOUS | Status: DC

## 2020-10-05 MED ORDER — HYDROMORPHONE HCL 1 MG/ML IJ SOLN
0.5000 mg | Freq: Once | INTRAMUSCULAR | Status: AC
Start: 2020-10-05 — End: 2020-10-05
  Administered 2020-10-05: 0.5 mg via INTRAVENOUS
  Filled 2020-10-05: qty 1

## 2020-10-05 MED ORDER — ALBUMIN HUMAN 25 % IV SOLN
50.0000 g | Freq: Once | INTRAVENOUS | Status: AC
  Administered 2020-10-05: 50 g via INTRAVENOUS
  Filled 2020-10-05: qty 200

## 2020-10-05 MED ORDER — PIPERACILLIN-TAZOBACTAM 3.375 G IVPB 30 MIN
3.3750 g | Freq: Once | INTRAVENOUS | Status: AC
  Administered 2020-10-05: 3.375 g via INTRAVENOUS
  Filled 2020-10-05: qty 50

## 2020-10-05 MED ORDER — FENTANYL 50 MCG/HR TD PT72
1.0000 | MEDICATED_PATCH | TRANSDERMAL | Status: DC
Start: 2020-10-06 — End: 2020-10-09
  Administered 2020-10-06: 1 via TRANSDERMAL
  Filled 2020-10-05: qty 1

## 2020-10-05 MED ORDER — ALBUTEROL SULFATE (2.5 MG/3ML) 0.083% IN NEBU
2.5000 mg | INHALATION_SOLUTION | Freq: Four times a day (QID) | RESPIRATORY_TRACT | Status: DC
  Administered 2020-10-05 – 2020-10-06 (×3): 2.5 mg via RESPIRATORY_TRACT
  Filled 2020-10-05 (×3): qty 3

## 2020-10-05 MED ORDER — NICOTINE 14 MG/24HR TD PT24
14.0000 mg | MEDICATED_PATCH | Freq: Every day | TRANSDERMAL | Status: DC
  Administered 2020-10-06 – 2020-10-09 (×4): 14 mg via TRANSDERMAL
  Filled 2020-10-05 (×4): qty 1

## 2020-10-05 MED ORDER — OXYCODONE-ACETAMINOPHEN 5-325 MG PO TABS
1.0000 | ORAL_TABLET | ORAL | Status: DC | PRN
  Administered 2020-10-05 – 2020-10-09 (×20): 1 via ORAL
  Filled 2020-10-05 (×20): qty 1

## 2020-10-05 MED ORDER — PIPERACILLIN-TAZOBACTAM 3.375 G IVPB
3.3750 g | Freq: Three times a day (TID) | INTRAVENOUS | Status: DC
  Administered 2020-10-06 – 2020-10-07 (×5): 3.375 g via INTRAVENOUS
  Filled 2020-10-05 (×5): qty 50

## 2020-10-05 MED ORDER — LIDOCAINE HCL (PF) 1 % IJ SOLN
INTRAMUSCULAR | Status: AC
  Filled 2020-10-05: qty 30

## 2020-10-05 MED ORDER — HYDRALAZINE HCL 10 MG PO TABS: 10.0000 mg | ORAL_TABLET | Freq: Four times a day (QID) | ORAL | Status: DC | PRN

## 2020-10-05 NOTE — ED Notes (Signed)
Pt in recliner per his request.

## 2020-10-05 NOTE — Progress Notes (Signed)
Arrived from ED to 4E.  A&O Vital sign protocol initiated.  CCMD notified.  CHG bath compkleted

## 2020-10-05 NOTE — H&P (Addendum)
Noted  HPI  Wesley Hunt EBR:[id number] DOB: March 24, 1956 DOA: 10/05/2020  PCP: Glenda Chroman, MD   Chief Complaint: Sudden short of breath  HPI:  43 white male Chronic kidney disease stage II >30-pack-year history Cirrhosis of liver unclear etiology Duypuytren'scontracture TB in the 1970s Diagnosed 08/14/2020 with complete collapse of right middle lobe of lung with findings concerning for nodules of the lung-PET scan 09/18/2020 showed possible lesion R lung R mL hypermetabolic masses with collapse of right lower lobe and possible pleural mets  Patient was headed to ultrasound-guided core biopsy of right supraclavicular lymph node 10/05/2020 for work-up of this and became suddenly short of breath at interventional radiology  Patient reports actually 6 weeks ago he was able to walk fine did not have any shortness of breath-endorses chest pain with bending over and picking up something and that is been going on for the last 5 to 6 days-sensation of discomfort in chest and feeling as if he needs to cough but cannot get it up No nausea no vomiting Patient has lost 30 to 40 pounds in the past several weeks and has not really eaten in the past 2 days No blurred vision no double vision no diarrhea--however has had dark stool over several weeks does not seem bloody to him  He does not endorse left-sided chest pain no radiation down the arm but does feel pain over his right lower rib as dictated above  At IR suite he became hypoxic to the hypoxic to the 60s, found to have intermittent tachycardia to the 160s and was sent straight to the emergency room  Tells me he has not been able to sleep flat for a while no endorsement of lower extremity edema  Review of Systems:  As above  Pertinent +'s: 10 item 12 organ system review negative except as above in HPI Pertinent -"s:  ED Course: Labs obtained including negative COVID IV access placed blood cultures X2 performed Dilaudid given times one  0.5   Past Medical History:  Diagnosis Date   Dupuytren's contracture    Past Surgical History:  Procedure Laterality Date   CLEFT PALATE REPAIR     FASCIECTOMY Right 10/12/2019   Procedure: FASCIECTOMY RIGHT RING AND RIGHT SMALL FINGER;  Surgeon: Daryll Brod, MD;  Location: [place];  Service: Orthopedics;  Laterality: Right;    reports that he has been smoking cigarettes. He has been smoking an average of 0.50 packs per day. He has never used smokeless tobacco. He reports that he does not drink alcohol and does not use drugs.  Mobility:   No Known Allergies No family history on file. Prior to Admission medications   Medication Sig Start Date End Date Taking? Authorizing Provider  fentaNYL (DURAGESIC) 50 MCG/HR Place 1 patch onto the skin every 3 (three) days. 09/29/20   [provider]  levofloxacin (LEVAQUIN) 500 MG tablet Take 500 mg by mouth daily. 09/28/20   [provider]  polyethylene glycol-electrolytes (TRILYTE) 420 g solution Take 4,000 mLs by mouth as directed. Patient not taking: No sig reported 09/20/20   Montez Morita, Quillian Quince, MD  predniSONE (DELTASONE) 10 MG tablet Take 10 mg by mouth 2 (two) times daily. 09/28/20   [provider]    Physical Exam:  Vitals:   10/05/20 1515 10/05/20 1530  BP: (!) 145/86 (!) 139/91  Pulse: 95 87  Resp: 20 17  Temp:    SpO2: 96% 97%    Cachectic white male poor dentition hard  to interpret speech because of this No icterus no pallor Cannot appreciate JVD H9-Q2 holosystolic murmur best heard at RUSB with radiation Dull percussion note and fremitus on left posterior lung field Abdomen soft nontender no rebound no guarding Neurologically intact no focal deficit Psych euthymic congruent No lower extremity edema no rash   I have personally reviewed following labs and imaging studies  Labs:  sodium 133 BUNs/creatinine 16/0.4 alk phos 150 BNP 151 Troponin 10 Lactic acid 1.4 WBC  16  Imaging studies:  X-ray shows complete opacification right hemithorax combo pleural effusion complete right lung atelectasis  Medical tests:  EKG independently reviewed: Sinus, sinus tachycardia PR interval 0.08 QRS axis 45 degrees No ST-T wave changes across precordium although low voltage complexes  Test discussed with performing physician: D/w Dr. Bethanne Ginger Pulomonary   Decision to obtain old records:     Review and summation of old records:     Active Problems:   * No active hospital problems. *   Assessment/Plan Large right-sided pleural effusion-?  Malignant versus infectious IR consulted by emergency room I will consult pulmonary as patient may require further ancillary management and/or larger volume to be removed Keep sats above 90 Keep n.p.o. until reviewed by consultant to see if we can get thoracentesis performed today--we will obtain labs I will start the patient on Zosyn prophylactically given this could be postobstructive in addition and his WBC, lactic acid is slightly elevated Patient will be admitted to progressive unit given risk for decompensation with his large effusion/right lung process Metastatic lung cancer Dr. Earlie Server to be added to treatment team in addition to Mrs. Curcio We will try to obtain cytology from pleural tap although this is not usually high yield Mild to moderate COPD at least Monitor trends-no wheeze in contralateral lung-hold steroids at this time It looks like he was recently treated with Levaquin and prednisone in either event for what was presumed to be a COPD flare and I would hold that treatment for now ?  A. fib in IR suite I do not have tracings from his reported episodic A. fib EKG here shows mild sinus tach We will monitor him on monitors and reassess him Preferentially use calcium channel blocker >beta-blocker if he develops A. fib Hold any anticoagulation at this time ?  Heart failure?  Right upper sternal  murmur suggestive of aortic stenosis We will obtain nonemergent echo to rule out heart failure and comment on aortic valve Hold any Lasix at this time Cirrhosis possibly secondary to hepatitis C? Patient states he contracted this in prison? Obtain LFTs a.m. in addition to INR to calculate meld score Obtain HCVRNA Cologuard positive recently 5/23 Outpatient discussions with his GI Dr. Jenetta Downer and or Dr. Essie Hart tells me he has had some dark stools over several weeks but no blood      Severity of Illness: The appropriate patient status for this patient is INPATIENT. Inpatient status is judged to be reasonable and necessary in order to provide the required intensity of service to ensure the patient's safety. The patient's presenting symptoms, physical exam findings, and initial radiographic and laboratory data in the context of their chronic comorbidities is felt to place them at high risk for further clinical deterioration. Furthermore, it is not anticipated that the patient will be medically stable for discharge from the hospital within 2 midnights of admission. The following factors support the patient status of inpatient.   " The patient's presenting symptoms include shortness of breath. " The  worrisome physical exam findings include tachycardic and tachypneic. " The initial radiographic and laboratory data are worrisome because of risk of acute decompensation. " The chronic co-morbidities include cancer.   * I certify that at the point of admission it is my clinical judgment that the patient will require inpatient hospital care spanning beyond 2 midnights from the point of admission due to high intensity of service, high risk for further deterioration and high frequency of surveillance required.*   DVT prophylaxis: SCD at this time Code Status: Patient confirmed full CODE STATUS Family Communication: None at the bedside Consults called: Pulmonology, interventional  radiology  Time spent: 75 minutes  Verlon Au, MD [days-call my NP partners at night for Care related issues] [facility] --[facility] [employer] [facility] , [email or web address]; password [facility]  10/05/2020, 3:52 PM

## 2020-10-05 NOTE — ED Notes (Signed)
Patient to IR

## 2020-10-05 NOTE — Progress Notes (Addendum)
  Patient here today for lymph node biopsy.  Called by Short Stay RN to evaluate patient due to being profoundly short of breath.  RN had already placed O2 on patient and O2 Sat was up to 96% upon my arrival.  Pulse was bouncing between 90's and 130's.  CXR ordered STAT - my interpretation = large right pleural effusion. Had not been officially read by radiologist.  Stat 12 lead EKG showed Afib with RVR.  Patient too unstable for elective biopsy today.  Patient sent to ED for evaluation.  Will proceed with lymph node biopsy when patient medically stable.  May benefit from right thoracentesis.  Judie Grieve Rotunda Worden PA-C 10/05/2020 12:31 PM

## 2020-10-05 NOTE — ED Notes (Signed)
Pt transported to IR 

## 2020-10-05 NOTE — ED Triage Notes (Signed)
Patient arrives POV to short stay for a lung biopsy. While patient was being prepped for biopsy,patient became short of breath, requiring supplemental O2 (2 L [place]). Pt also had bouts of tachycardia with HR in the 160s. Pt Aox4. VSS. Pt on 2 L [place] O2 and feeling like breathing has improved since O2 has been applied.

## 2020-10-05 NOTE — Procedures (Signed)
PROCEDURE SUMMARY:  Successful image-guided right thoracentesis. Yielded 1.9 L of clear amber fluid. Pt tolerated procedure well. No immediate complications. EBL = trace   Specimen was sent for labs. CXR ordered.  Please see imaging section of Epic for full dictation.  Armando Gang Mattox Schorr PA-C 10/05/2020 4:47 PM

## 2020-10-05 NOTE — Hospital Course (Signed)
CT chest 08/14/20 >> complete collapse of RML with soft tissue density along medial RUL with multiple enlarged mediastinal and Rt hilar nodes, b/l pulmonary nodules, small Rt pleural effusion, cirrhosis with splenomegaly   Consult-sent to IR for biopsy--cannot lie flat for several weeks, prep for biopsy--felt SOB  Significant Plueral effusion based on CXR Has White out R lung  Onoclogy seems to think   Brewing for a couple of weeks

## 2020-10-05 NOTE — Progress Notes (Signed)
Janit Pagan notified of client c/o shortness of breath and orders noted

## 2020-10-05 NOTE — Progress Notes (Signed)
Patient reported 8/10 sharp RUQ abdominal pain right after the thoracentesis.   Denied SOB, nausea/vomiting.  Discussed with Dr. Kathlene Cote, no intervention needed.   Informed Dr. Tomi Bamberger in ED.   Please call IR for questions and concerns regarding thoracentesis.    Armando Gang Shadd Dunstan PA-C 10/05/2020 4:55 PM

## 2020-10-05 NOTE — Progress Notes (Signed)
Pharmacy Antibiotic Note  Wesley Hunt is a 65 y.o. male admitted on 10/05/2020 with pneumonia.  Pharmacy has been consulted for Zosyn dosing.  WBC 16.6, SCr 0.49  Plan: -Zosyn 3.375 gm IV Q 8 hours (EI infusion) -Monitor CBC, renal fx, cultures and clinical progress  Height: 5\' 7"  (170.2 cm) Weight: 66.2 kg (145 lb 15.1 oz) IBW/kg (Calculated) : 66.1  Temp (24hrs), Avg:98.3 F (36.8 C), Min:98.1 F (36.7 C), Max:98.4 F (36.9 C)  Recent Labs  Lab 10/03/20 1348 10/05/20 1306 10/05/20 1322  WBC 14.7* 16.6*  --   CREATININE 0.63 0.49*  --   LATICACIDVEN  --   --  1.4    Estimated Creatinine Clearance: 84.9 mL/min (A) (by C-G formula based on SCr of 0.49 mg/dL (L)).    No Known Allergies  Antimicrobials this admission: Zosyn 6/9 >>   Dose adjustments this admission:   Microbiology results: 6/9 BCx:    Thank you for allowing pharmacy to be a part of this patient's care.  Albertina Parr, PharmD., BCPS, BCCCP Clinical Pharmacist Please refer to [facility] for unit-specific pharmacist

## 2020-10-05 NOTE — Progress Notes (Signed)
Transferred to ER via stretcher with cardiac monitor

## 2020-10-05 NOTE — ED Provider Notes (Signed)
[facility] EMERGENCY DEPARTMENT Provider Note   CSN: [id number] Arrival date & time: 10/05/20  1239     History Chief Complaint  Patient presents with   Shortness of Breath    Wesley Hunt is a 65 y.o. male.  65 year old male with prior medical history as detailed below presents for evaluation.  Patient apparently was sent for biopsy today.  Patient was laid flat for the procedure.  He became uncomfortable and could not catch his breath.  Patient reports that this has been a problem for him for several weeks.  Patient was noted to be tachycardic and hypoxic when laying flat.  Patient was sent to the ED for evaluation.  Patient is in the beginning stages of a work-up of likely metastatic lung cancer.  The history is provided by the patient and medical records.  Shortness of Breath Severity:  Moderate Onset quality:  Sudden Duration:  1 hour Timing:  Intermittent Progression:  Worsening Chronicity:  New Context comment:  Significant distress with laying flat     Past Medical History:  Diagnosis Date   Dupuytren's contracture     Patient Active Problem List   Diagnosis Date Noted   Malignant neoplasm of upper lobe of right lung ([place]) 10/03/2020   Positive colorectal cancer screening using Cologuard test 09/18/2020   Abnormal liver diagnostic imaging 09/18/2020    Past Surgical History:  Procedure Laterality Date   CLEFT PALATE REPAIR     FASCIECTOMY Right 10/12/2019   Procedure: FASCIECTOMY RIGHT RING AND RIGHT SMALL FINGER;  Surgeon: Daryll Brod, MD;  Location: [place];  Service: Orthopedics;  Laterality: Right;       No family history on file.  Social History   Tobacco Use   Smoking status: Every Day    Packs/day: 0.50    Pack years: 0.00    Types: Cigarettes   Smokeless tobacco: Never   Tobacco comments:    smokes 5 cigarettes per day 08/25/20  Vaping Use   Vaping Use: Never used  Substance Use Topics   Alcohol  use: Never   Drug use: Never    Home Medications Prior to Admission medications   Medication Sig Start Date End Date Taking? Authorizing Provider  fentaNYL (DURAGESIC) 50 MCG/HR Place 1 patch onto the skin every 3 (three) days. 09/29/20   [provider]  levofloxacin (LEVAQUIN) 500 MG tablet Take 500 mg by mouth daily. 09/28/20   [provider]  polyethylene glycol-electrolytes (TRILYTE) 420 g solution Take 4,000 mLs by mouth as directed. Patient not taking: No sig reported 09/20/20   Montez Morita, Quillian Quince, MD  predniSONE (DELTASONE) 10 MG tablet Take 10 mg by mouth 2 (two) times daily. 09/28/20   [provider]    Allergies    Patient has no known allergies.  Review of Systems   Review of Systems  Respiratory:  Positive for shortness of breath.   All other systems reviewed and are negative.  Physical Exam Updated Vital Signs BP (!) 139/91   Pulse 87   Temp 98.4 F (36.9 C) (Oral)   Resp 17   Ht 5\' 7"  (1.702 m)   Wt 66.2 kg   SpO2 97%   BMI 22.86 kg/m   Physical Exam Vitals and nursing note reviewed.  Constitutional:      General: He is not in acute distress.    Appearance: He is well-developed.  HENT:     Head: Normocephalic and atraumatic.  Eyes:  Conjunctiva/sclera: Conjunctivae normal.     Pupils: Pupils are equal, round, and reactive to light.  Cardiovascular:     Rate and Rhythm: Normal rate and regular rhythm.     Heart sounds: Normal heart sounds.  Pulmonary:     Effort: Pulmonary effort is normal. No respiratory distress.     Breath sounds: Normal breath sounds.  Abdominal:     General: There is no distension.     Palpations: Abdomen is soft.     Tenderness: There is no abdominal tenderness.  Musculoskeletal:        General: No deformity. Normal range of motion.     Cervical back: Normal range of motion and neck supple.  Skin:    General: Skin is warm and dry.  Neurological:     Mental Status: He is alert and oriented  to person, place, and time.    ED Results / Procedures / Treatments   Labs (all labs ordered are listed, but only abnormal results are displayed) Labs Reviewed  COMPREHENSIVE METABOLIC PANEL - Abnormal; Notable for the following components:      Result Value   Sodium 133 (*)    Chloride 96 (*)    Glucose, Bld 100 (*)    Creatinine, Ser 0.49 (*)    Albumin 2.5 (*)    Alkaline Phosphatase 150 (*)    All other components within normal limits  CBC WITH DIFFERENTIAL/PLATELET - Abnormal; Notable for the following components:   WBC 16.6 (*)    Neutro Abs 15.1 (*)    Lymphs Abs 0.6 (*)    Abs Immature Granulocytes 0.09 (*)    All other components within normal limits  BRAIN NATRIURETIC PEPTIDE - Abnormal; Notable for the following components:   B Natriuretic Peptide 151.4 (*)    All other components within normal limits  RESP PANEL BY RT-PCR (FLU A&B, COVID) ARPGX2  CULTURE, BLOOD (ROUTINE X 2)  CULTURE, BLOOD (ROUTINE X 2)  LACTIC ACID, PLASMA  URINALYSIS, ROUTINE W REFLEX MICROSCOPIC  TYPE AND SCREEN  ABO/RH  TROPONIN I (HIGH SENSITIVITY)  TROPONIN I (HIGH SENSITIVITY)    EKG EKG Interpretation  Date/Time:  Thursday October 05 2020 13:45:32 EDT Ventricular Rate:  90 PR Interval:  125 QRS Duration: 80 QT Interval:  341 QTC Calculation: 418 R Axis:   52 Text Interpretation: Sinus rhythm Atrial premature complexes in couplets Confirmed by Dene Gentry [phone]) on 10/05/2020 1:52:33 PM  Radiology DG Chest 1 View  Result Date: 10/05/2020 CLINICAL DATA:  Shortness of breath EXAM: CHEST  1 VIEW COMPARISON:  09/18/2020 FINDINGS: Complete opacification of the right hemithorax compatible with a combination of pleural effusion and complete right lung atelectasis. Diffuse interstitial opacities throughout the left lung. A skin fold is seen partially superimposed over the left lung apex. No evidence of pneumothorax. No pathologic fractures are seen. IMPRESSION: Complete opacification of  the right hemithorax compatible with a combination of pleural effusion and complete right lung atelectasis. Findings slightly progressed from recent PET-CT. Electronically Signed   By: Davina Poke D.O.   On: 10/05/2020 12:33    Procedures Procedures   Medications Ordered in ED Medications  HYDROmorphone (DILAUDID) injection 0.5 mg (0.5 mg Intravenous Given 10/05/20 1415)    ED Course  I have reviewed the triage vital signs and the nursing notes.  Pertinent labs & imaging results that were available during my care of the patient were reviewed by me and considered in my medical decision making (see chart for details).  MDM Rules/Calculators/A&P                          MDM  MSE complete  Wesley Hunt was evaluated in Emergency Department on 10/05/2020 for the symptoms described in the history of present illness. He was evaluated in the context of the global COVID-19 pandemic, which necessitated consideration that the patient might be at risk for infection with the SARS-CoV-2 virus that causes COVID-19. Institutional protocols and algorithms that pertain to the evaluation of patients at risk for COVID-19 are in a state of rapid change based on information released by regulatory bodies including the CDC and federal and state organizations. These policies and algorithms were followed during the patient's care in the ED.   Patient is presenting with shortness of breath secondary to large right-sided pleural effusion.  Patient would benefit from admission.  Oncology service is aware of case and will consult.  Interventional radiology is aware of case and agrees with plan for thoracentesis.  Hospitalist services aware of case and will evaluate for admission.   Final Clinical Impression(s) / ED Diagnoses Final diagnoses:  Pleural effusion    Rx / DC Orders ED Discharge Orders     None        Valarie Merino, MD 10/05/20 1551

## 2020-10-06 ENCOUNTER — Inpatient Hospital Stay (HOSPITAL_COMMUNITY): Payer: Medicare Other

## 2020-10-06 ENCOUNTER — Ambulatory Visit: Payer: Medicare Other | Admitting: Pulmonary Disease

## 2020-10-06 ENCOUNTER — Telehealth: Payer: Self-pay | Admitting: Internal Medicine

## 2020-10-06 DIAGNOSIS — J90 Pleural effusion, not elsewhere classified: Secondary | ICD-10-CM | POA: Diagnosis not present

## 2020-10-06 DIAGNOSIS — I5030 Unspecified diastolic (congestive) heart failure: Secondary | ICD-10-CM

## 2020-10-06 LAB — COMPREHENSIVE METABOLIC PANEL
ALT: 21 U/L (ref 0–44)
AST: 37 U/L (ref 15–41)
Albumin: 2.9 g/dL — ABNORMAL LOW (ref 3.5–5.0)
Alkaline Phosphatase: 127 U/L — ABNORMAL HIGH (ref 38–126)
Anion gap: 9 (ref 5–15)
BUN: 19 mg/dL (ref 8–23)
CO2: 30 mmol/L (ref 22–32)
Calcium: 9 mg/dL (ref 8.9–10.3)
Chloride: 96 mmol/L — ABNORMAL LOW (ref 98–111)
Creatinine, Ser: 0.54 mg/dL — ABNORMAL LOW (ref 0.61–1.24)
GFR, Estimated: >60 mL/min (ref >60)
Glucose, Bld: 89 mg/dL (ref 70–99)
Potassium: 3.6 mmol/L (ref 3.5–5.1)
Sodium: 135 mmol/L (ref 135–145)
Total Bilirubin: 0.7 mg/dL (ref 0.3–1.2)
Total Protein: 6.2 g/dL — ABNORMAL LOW (ref 6.5–8.1)

## 2020-10-06 LAB — ECHOCARDIOGRAM COMPLETE
AR max vel: 1.8 cm2
AV Area VTI: 1.52 cm2
AV Area mean vel: 1.61 cm2
AV Mean grad: 19 mmHg
AV Peak grad: 32.9 mmHg
Ao pk vel: 2.87 m/s
Area-P 1/2: 4.68 cm2
Calc EF: 69.6 %
Height: 67 in
S' Lateral: 2.2 cm
Single Plane A2C EF: 72.6 %
Single Plane A4C EF: 65.1 %
Weight: 2335.11 oz

## 2020-10-06 LAB — CBC
HCT: 37.3 % — ABNORMAL LOW (ref 39.0–52.0)
Hemoglobin: 12.3 g/dL — ABNORMAL LOW (ref 13.0–17.0)
MCH: 30.8 pg (ref 26.0–34.0)
MCHC: 33 g/dL (ref 30.0–36.0)
MCV: 93.3 fL (ref 80.0–100.0)
Platelets: 331 10*3/uL (ref 150–400)
RBC: 4 MIL/uL — ABNORMAL LOW (ref 4.22–5.81)
RDW: 13.9 % (ref 11.5–15.5)
WBC: 16.2 10*3/uL — ABNORMAL HIGH (ref 4.0–10.5)
nRBC: 0 % (ref 0.0–0.2)

## 2020-10-06 LAB — PROTIME-INR
INR: 1.2 (ref 0.8–1.2)
Prothrombin Time: 15.2 seconds (ref 11.4–15.2)

## 2020-10-06 LAB — CYTOLOGY - NON PAP

## 2020-10-06 MED ORDER — MORPHINE SULFATE (PF) 2 MG/ML IV SOLN
1.0000 mg | Freq: Once | INTRAVENOUS | Status: AC | PRN
  Administered 2020-10-06: 1 mg via INTRAVENOUS
  Filled 2020-10-06: qty 1

## 2020-10-06 MED ORDER — ALBUTEROL SULFATE (2.5 MG/3ML) 0.083% IN NEBU
2.5000 mg | INHALATION_SOLUTION | Freq: Two times a day (BID) | RESPIRATORY_TRACT | Status: DC
  Administered 2020-10-06 – 2020-10-07 (×2): 2.5 mg via RESPIRATORY_TRACT
  Filled 2020-10-06 (×3): qty 3

## 2020-10-06 NOTE — Progress Notes (Signed)
PROGRESS NOTE   Wesley Hunt  ALP:[id number] DOB: 1956-01-05 DOA: 10/05/2020 PCP: Glenda Chroman, MD  Brief Narrative: 65 year old male Metastatic right lung cancer complicated by large right-sided pleural effusion-final diagnosis still not clarified as new diagnosis followed by Dr. Earlie Server Moderate COPD continued smoker Hepatitis C complicated by cirrhosis ?  New colon cancer followed by GI Presumed aortic stenosis Tuberculosis in the 1970s  Patient admitted from interventional radiology suite 10/05/2020 SOB with background progressive dyspnea over the past 6 weeks  Found to have white out lung right side IR consulted-thoracentesis performed Oncology consulted, XRT consulted should be transferred to [facility] 10/08/2020 for consideration of XRT commencement under Dr. Lisbeth Renshaw  Hospital-Problem based course  Acute hypoxic respiratory failure 2/2 large right-sided pleural effusion + metastatic cancer---presumed lung primary Episodic desaturation whenever patient lays flat Chest x-ray repeat shows persistence of mass, fluid with complete opacification of right hemithorax Monitor closely on progressive unit Continue Zosyn at this time and de-escalate in the next several days Metastatic lung disease--extensive as per last note by Dr. Earlie Server 6/7 Therapeutic versus palliative trajectory discussed with patient and sister at bedside [name] I explained to him that he might have some chances at palliation but his disease processes extensive I have asked him to discuss clearly with his oncologist next steps and have asked him to consider DNR status as he currently remains full code--we will revisit this discussion over the weekend Severe aortic stenosis calcification with mild to moderate stenosis valve area 1.5 Keep volumes even today, hold fluids-consider in the next 24 hours Lasix 20 mg daily if blood pressure remains stable By Lights criterion, Fluid Exudative, likely in keeping c  malignancy Moderate COPD still smoker Nicotine patch 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 Await HCV RNA  DVT prophylaxis: SCD Code Status: Full code confirmed at the bedside Family Communication: Discussed with Lesle Chris at the bedside-her phone number [phone] We spent 20 minutes discussing in addition to advance care planning time  Disposition:  Status is: Inpatient  Remains inpatient appropriate because:Hemodynamically unstable, Unsafe d/c plan, and IV treatments appropriate due to intensity of illness or inability to take PO  Dispo: The patient is from: Home              Anticipated d/c is to:  Unclear at this time              Patient currently is not medically stable to d/c.   Difficult to place patient No       Consultants:  Multiple  Procedures  Antimicrobials:   Zosyn since 6/9   Subjective: Called to the bedside this morning as patient quite diaphoretic and SOB 2/2 having echo done He is much improved sitting semierect, sats are good and he is able to verbalize well - Fever, - chills, - sputum, - diarrhea + Underlying dyspnea which she states is better than prior  Objective: Vitals:   10/06/20 0402 10/06/20 0732 10/06/20 0755 10/06/20 1150  BP: (!) 166/91 (!) 163/91  (!) 167/86  Pulse: 86 84  87  Resp: 20 16  18   Temp: 98 F (36.7 C) 97.9 F (36.6 C)  98.1 F (36.7 C)  TempSrc: Oral Oral  Oral  SpO2: 95% 96% 95% 95%  Weight:      Height:        Intake/Output Summary (Last 24 hours) at 10/06/2020 1449 Last data filed at 10/06/2020 1226 Gross per 24 hour  Intake 466.23 ml  Output 500 ml  Net -33.77  ml   Filed Weights   10/05/20 1305  Weight: 66.2 kg    Examination:  Alert coherent no distress quite cachectic poor dentition No rales Increased percussion note right posterolateral lung fields [id number] olosystolic murmur however noted in RUSB Abdomen soft no rebound no guarding No lower extremity edema mucosas little bit dry Neurologically intact  moving all 4 limbs without deficit  Data Reviewed: personally reviewed   CBC    Component Value Date/Time   WBC 16.2 (H) 10/06/2020 0301   RBC 4.00 (L) 10/06/2020 0301   HGB 12.3 (L) 10/06/2020 0301   HGB 13.5 10/03/2020 1348   HCT 37.3 (L) 10/06/2020 0301   PLT 331 10/06/2020 0301   PLT 404 (H) 10/03/2020 1348   MCV 93.3 10/06/2020 0301   MCH 30.8 10/06/2020 0301   MCHC 33.0 10/06/2020 0301   RDW 13.9 10/06/2020 0301   LYMPHSABS 0.6 (L) 10/05/2020 1306   MONOABS 0.8 10/05/2020 1306   EOSABS 0.0 10/05/2020 1306   BASOSABS 0.0 10/05/2020 1306   CMP Latest Ref Rng & Units 10/06/2020 10/05/2020 10/03/2020  Glucose 70 - 99 mg/dL 89 100(H) 108(H)  BUN 8 - 23 mg/dL 19 16 19   Creatinine 0.61 - 1.24 mg/dL 0.54(L) 0.49(L) 0.63  Sodium 135 - 145 mmol/L 135 133(L) 135  Potassium 3.5 - 5.1 mmol/L 3.6 4.0 4.1  Chloride 98 - 111 mmol/L 96(L) 96(L) 97(L)  CO2 22 - 32 mmol/L 30 27 27   Calcium 8.9 - 10.3 mg/dL 9.0 9.2 9.8  Total Protein 6.5 - 8.1 g/dL 6.2(L) 6.8 7.5  Total Bilirubin 0.3 - 1.2 mg/dL 0.7 0.7 0.6  Alkaline Phos 38 - 126 U/L 127(H) 150(H) 192(H)  AST 15 - 41 U/L 37 38 44(H)  ALT 0 - 44 U/L 21 25 23      Radiology Studies: DG Chest 1 View  Result Date: 10/05/2020 CLINICAL DATA:  Shortness of breath EXAM: CHEST  1 VIEW COMPARISON:  09/18/2020 FINDINGS: Complete opacification of the right hemithorax compatible with a combination of pleural effusion and complete right lung atelectasis. Diffuse interstitial opacities throughout the left lung. A skin fold is seen partially superimposed over the left lung apex. No evidence of pneumothorax. No pathologic fractures are seen. IMPRESSION: Complete opacification of the right hemithorax compatible with a combination of pleural effusion and complete right lung atelectasis. Findings slightly progressed from recent PET-CT. Electronically Signed   By: Davina Poke D.O.   On: 10/05/2020 12:33   DG CHEST PORT 1 VIEW  Result Date:  10/06/2020 CLINICAL DATA:  Pleural effusion. EXAM: PORTABLE CHEST 1 VIEW COMPARISON:  October 05, 2020. FINDINGS: Stable complete opacification of right hemithorax consistent with combination of effusion and atelectasis. Minimal left basilar subsegmental atelectasis is noted. No pneumothorax is noted. Bony thorax is unremarkable. IMPRESSION: Stable complete opacification of right hemithorax is noted consistent with combination of pleural effusion and atelectasis. Electronically Signed   By: Marijo Conception M.D.   On: 10/06/2020 13:04   DG Chest Port 1 View  Result Date: 10/05/2020 CLINICAL DATA:  Status post right thoracentesis with removal of 1.9 L of fluid. Prior imaging has demonstrated evidence of extensive hypermetabolic tumor in the right chest with metastatic lymphadenopathy. EXAM: PORTABLE CHEST 1 VIEW COMPARISON:  PET scan on 09/18/2020 FINDINGS: After thoracentesis there is a minimal amount of aeration of the right upper lung with residual dense consolidation of the right lung and probably some residual pleural fluid remaining. Findings are consistent with obstructive tumor centrally which is not  allowing much aeration to occur of the underlying lung. No pneumothorax. Expansion and aeration of the left lung is improved compared to the chest x-ray earlier today. IMPRESSION: Minimal aeration of the right upper lung after large volume right-sided thoracentesis. Findings are consistent with known obstructive tumor centrally which is not allowing much aeration to occur of the right lung. Left lung expansion has improved. No pneumothorax. Electronically Signed   By: Aletta Edouard M.D.   On: 10/05/2020 17:15   ECHOCARDIOGRAM COMPLETE  Result Date: 10/06/2020    ECHOCARDIOGRAM REPORT   Patient Name:   Wesley Hunt Date of Exam: 10/06/2020 Medical Rec #:  [id number]      Height:       67.0 in Accession #:    [id number]     Weight:       145.9 lb Date of Birth:  03-23-1956      BSA:          1.769 m Patient  Age:    27 years       BP:           163/91 mmHg Patient Gender: M              HR:           88 bpm. Exam Location:  Inpatient Procedure: 2D Echo, 3D Echo, Cardiac Doppler and Color Doppler Indications:    I50.40* Unspecified combined systolic (congestive) and diastolic                 (congestive) heart failure  History:        Patient has no prior history of Echocardiogram examinations.                 Abnormal ECG, COPD, Arrythmias:Atrial Fibrillation,                 Signs/Symptoms:Shortness of Breath and Dyspnea; Risk                 Factors:Current Smoker. Cancer. Drug use. Pleural effusion. Post                 thoracentesis.  Sonographer:    Roseanna Rainbow RDCS Referring Phys: [phone] [facility]  Sonographer Comments: Patient in pain and moving throughout test. IMPRESSIONS  1. Left ventricular ejection fraction, by estimation, is 65 to 70%. Left ventricular ejection fraction by 3D volume is 62 %. The left ventricle has normal function. The left ventricle has no regional wall motion abnormalities. There is moderate concentric left ventricular hypertrophy. Left ventricular diastolic parameters are consistent with Grade I diastolic dysfunction (impaired relaxation). Elevated left ventricular end-diastolic pressure.  2. Right ventricular systolic function was not well visualized. The right ventricular size is normal. Tricuspid regurgitation signal is inadequate for assessing PA pressure.  3. The mitral valve is normal in structure. Mild mitral valve regurgitation. No evidence of mitral stenosis.  4. The aortic valve is calcified. There is severe calcifcation of the aortic valve. There is severe thickening of the aortic valve. Aortic valve regurgitation is not visualized. Mild to moderate aortic valve stenosis. Aortic valve area, by VTI measures 1.52 cm. Aortic valve mean gradient measures 19.0 mmHg. Aortic valve Vmax measures 2.87 m/s.  5. Aortic dilatation noted. There is borderline dilatation of the  ascending aorta, measuring 37 mm.  6. The inferior vena cava is dilated in size with >50% respiratory variability, suggesting right atrial pressure of 8 mmHg. FINDINGS  Left Ventricle: Left ventricular ejection fraction, by  estimation, is 65 to 70%. Left ventricular ejection fraction by 3D volume is 62 %. The left ventricle has normal function. The left ventricle has no regional wall motion abnormalities. The left ventricular internal cavity size was normal in size. There is moderate concentric left ventricular hypertrophy. Left ventricular diastolic parameters are consistent with Grade I diastolic dysfunction (impaired relaxation). Elevated left ventricular end-diastolic pressure. Right Ventricle: The right ventricular size is normal. No increase in right ventricular wall thickness. Right ventricular systolic function was not well visualized. Tricuspid regurgitation signal is inadequate for assessing PA pressure. Left Atrium: Left atrial size was normal in size. Right Atrium: Right atrial size was normal in size. Pericardium: There is no evidence of pericardial effusion. Mitral Valve: The mitral valve is normal in structure. Mild mitral annular calcification. Mild mitral valve regurgitation. No evidence of mitral valve stenosis. Tricuspid Valve: The tricuspid valve is normal in structure. Tricuspid valve regurgitation is trivial. No evidence of tricuspid stenosis. Aortic Valve: The aortic valve is calcified. There is severe calcifcation of the aortic valve. There is severe thickening of the aortic valve. Aortic valve regurgitation is not visualized. Mild to moderate aortic stenosis is present. Aortic valve mean gradient measures 19.0 mmHg. Aortic valve peak gradient measures 32.9 mmHg. Aortic valve area, by VTI measures 1.52 cm. Pulmonic Valve: The pulmonic valve was normal in structure. Pulmonic valve regurgitation is not visualized. No evidence of pulmonic stenosis. Aorta: Aortic dilatation noted. There is  borderline dilatation of the ascending aorta, measuring 37 mm. Venous: The inferior vena cava is dilated in size with greater than 50% respiratory variability, suggesting right atrial pressure of 8 mmHg. IAS/Shunts: The interatrial septum appears to be lipomatous. No atrial level shunt detected by color flow Doppler.  LEFT VENTRICLE PLAX 2D LVIDd:         3.60 cm         Diastology LVIDs:         2.20 cm         LV e' medial:    5.33 cm/s LV PW:         1.40 cm         LV E/e' medial:  19.7 LV IVS:        1.50 cm         LV e' lateral:   6.31 cm/s LVOT diam:     2.00 cm         LV E/e' lateral: 16.6 LV SV:         97 LV SV Index:   55 LVOT Area:     3.14 cm        3D Volume EF                                LV 3D EF:    Left                                             ventricular LV Volumes (MOD)                            ejection LV vol d, MOD    72.9 ml                    fraction by  A2C:                                        3D volume LV vol d, MOD    66.7 ml                    is 62 %. A4C: LV vol s, MOD    20.0 ml A2C:                           3D Volume EF: LV vol s, MOD    23.3 ml       3D EF:        62 % A4C: LV SV MOD A2C:   52.9 ml LV SV MOD A4C:   66.7 ml LV SV MOD BP:    50.1 ml RIGHT VENTRICLE             IVC RV S prime:     15.90 cm/s  IVC diam: 2.10 cm TAPSE (M-mode): 2.7 cm LEFT ATRIUM             Index       RIGHT ATRIUM          Index LA diam:        3.20 cm 1.81 cm/m  RA Area:     6.45 cm LA Vol (A2C):   27.5 ml 15.55 ml/m RA Volume:   8.04 ml  4.55 ml/m LA Vol (A4C):   27.6 ml 15.60 ml/m LA Biplane Vol: 27.5 ml 15.55 ml/m  AORTIC VALVE AV Area (Vmax):    1.80 cm AV Area (Vmean):   1.61 cm AV Area (VTI):     1.52 cm AV Vmax:           287.00 cm/s AV Vmean:          209.000 cm/s AV VTI:            0.641 m AV Peak Grad:      32.9 mmHg AV Mean Grad:      19.0 mmHg LVOT Vmax:         164.00 cm/s LVOT Vmean:        107.250 cm/s LVOT VTI:          0.310 m LVOT/AV VTI ratio: 0.48  AORTA Ao  Root diam: 3.00 cm Ao Asc diam:  3.70 cm MITRAL VALVE MV Area (PHT): 4.68 cm     SHUNTS MV Decel Time: 162 msec     Systemic VTI:  0.31 m MV E velocity: 105.00 cm/s  Systemic Diam: 2.00 cm MV A velocity: 145.00 cm/s MV E/A ratio:  0.72 Fransico Him MD Electronically signed by Fransico Him MD Signature Date/Time: 10/06/2020/12:25:34 PM    Final    IR THORACENTESIS ASP PLEURAL SPACE W/IMG GUIDE  Result Date: 10/05/2020 INDICATION: Right pleural effusion, shortness of breast. Request for therapeutic and diagnostic thoracentesis. EXAM: ULTRASOUND GUIDED RIGHT THORACENTESIS MEDICATIONS: 10 mL 1% lidocaine COMPLICATIONS: None immediate. PROCEDURE: An ultrasound guided thoracentesis was thoroughly discussed with the patient and questions answered. The benefits, risks, alternatives and complications were also discussed. The patient understands and wishes to proceed with the procedure. Written consent was obtained. Ultrasound was performed to localize and mark an adequate pocket of fluid in the right chest. The area was then prepped and draped in the normal sterile fashion. 1% Lidocaine  was used for local anesthesia. Under ultrasound guidance a 6 Fr Safe-T-Centesis catheter was introduced. Thoracentesis was performed. The catheter was removed and a dressing applied. FINDINGS: A total of approximately 1.9 L of clear amber fluid was removed. Samples were sent to the laboratory as requested by the clinical team. Postprocedural chest x-ray ordered, pending. IMPRESSION: Successful ultrasound guided right thoracentesis yielding 1.9 L of pleural fluid. Read by: Durenda Guthrie, PA-C Electronically Signed   By: Aletta Edouard M.D.   On: 10/05/2020 16:58     Scheduled Meds:  albuterol  2.5 mg Nebulization BID   fentaNYL  1 patch Transdermal Q3 days   nicotine  14 mg Transdermal Daily   Continuous Infusions:  sodium chloride 50 mL/hr at 10/05/20 1745   piperacillin-tazobactam (ZOSYN)  IV 3.375 g (10/06/20 0837)     LOS: 1  day   Time spent: [place], MD [facility] To contact the attending provider between 7A-7P or the covering provider during after hours 7P-7A, please log into the web site [email or web address] and access using universal [place] password for that web site. If you do not have the password, please call the hospital operator.  10/06/2020, 2:49 PM

## 2020-10-06 NOTE — Telephone Encounter (Signed)
Scheduled per los. Patient will receive AVS when discharged from hospital per Nurse

## 2020-10-06 NOTE — Progress Notes (Signed)
  Echocardiogram 2D Echocardiogram has been performed.  Wesley Hunt 10/06/2020, 12:01 PM

## 2020-10-07 DIAGNOSIS — Z7189 Other specified counseling: Secondary | ICD-10-CM

## 2020-10-07 DIAGNOSIS — C3490 Malignant neoplasm of unspecified part of unspecified bronchus or lung: Secondary | ICD-10-CM

## 2020-10-07 DIAGNOSIS — Z66 Do not resuscitate: Secondary | ICD-10-CM

## 2020-10-07 DIAGNOSIS — Z515 Encounter for palliative care: Secondary | ICD-10-CM | POA: Diagnosis not present

## 2020-10-07 DIAGNOSIS — J90 Pleural effusion, not elsewhere classified: Secondary | ICD-10-CM | POA: Diagnosis not present

## 2020-10-07 DIAGNOSIS — Z79899 Other long term (current) drug therapy: Secondary | ICD-10-CM

## 2020-10-07 DIAGNOSIS — C7951 Secondary malignant neoplasm of bone: Secondary | ICD-10-CM

## 2020-10-07 DIAGNOSIS — J9620 Acute and chronic respiratory failure, unspecified whether with hypoxia or hypercapnia: Secondary | ICD-10-CM

## 2020-10-07 DIAGNOSIS — Z48813 Encounter for surgical aftercare following surgery on the respiratory system: Secondary | ICD-10-CM

## 2020-10-07 LAB — TROPONIN I (HIGH SENSITIVITY)
Troponin I (High Sensitivity): 12 ng/L (ref <18)
Troponin I (High Sensitivity): 11 ng/L (ref <18)

## 2020-10-07 LAB — ACID FAST SMEAR (AFB, MYCOBACTERIA): Acid Fast Smear: NEGATIVE

## 2020-10-07 LAB — HCV RNA QUANT: HCV Quantitative: NOT DETECTED IU/mL (ref >50)

## 2020-10-07 LAB — PATHOLOGIST SMEAR REVIEW

## 2020-10-07 MED ORDER — POLYVINYL ALCOHOL 1.4 % OP SOLN
1.0000 [drp] | Freq: Four times a day (QID) | OPHTHALMIC | Status: DC | PRN
  Filled 2020-10-07: qty 15

## 2020-10-07 MED ORDER — HALOPERIDOL LACTATE 2 MG/ML PO CONC
0.5000 mg | ORAL | Status: DC | PRN
  Filled 2020-10-07: qty 0.3

## 2020-10-07 MED ORDER — HALOPERIDOL LACTATE 5 MG/ML IJ SOLN: 0.5000 mg | INTRAMUSCULAR | Status: DC | PRN

## 2020-10-07 MED ORDER — FENTANYL CITRATE (PF) 100 MCG/2ML IJ SOLN
25.0000 ug | INTRAMUSCULAR | Status: DC | PRN
  Administered 2020-10-07 – 2020-10-09 (×7): 25 ug via INTRAVENOUS
  Filled 2020-10-07 (×7): qty 2

## 2020-10-07 MED ORDER — BIOTENE DRY MOUTH MT LIQD: 15.0000 mL | OROMUCOSAL | Status: DC | PRN

## 2020-10-07 MED ORDER — ACETAMINOPHEN 650 MG RE SUPP: 650.0000 mg | Freq: Four times a day (QID) | RECTAL | Status: DC | PRN

## 2020-10-07 MED ORDER — GLYCOPYRROLATE 0.2 MG/ML IJ SOLN
0.2000 mg | INTRAMUSCULAR | Status: DC | PRN
Start: 2020-10-07 — End: 2020-10-10

## 2020-10-07 MED ORDER — GLYCOPYRROLATE 1 MG PO TABS
1.0000 mg | ORAL_TABLET | ORAL | Status: DC | PRN
  Filled 2020-10-07: qty 1

## 2020-10-07 MED ORDER — ACETAMINOPHEN 325 MG PO TABS: 650.0000 mg | ORAL_TABLET | Freq: Four times a day (QID) | ORAL | Status: DC | PRN

## 2020-10-07 MED ORDER — ONDANSETRON HCL 4 MG/2ML IJ SOLN
4.0000 mg | Freq: Four times a day (QID) | INTRAMUSCULAR | Status: DC | PRN
Start: 2020-10-07 — End: 2020-10-10

## 2020-10-07 MED ORDER — ONDANSETRON 4 MG PO TBDP: 4.0000 mg | ORAL_TABLET | Freq: Four times a day (QID) | ORAL | Status: DC | PRN

## 2020-10-07 MED ORDER — DILTIAZEM HCL 60 MG PO TABS
30.0000 mg | ORAL_TABLET | Freq: Four times a day (QID) | ORAL | Status: DC
  Administered 2020-10-07 – 2020-10-09 (×11): 30 mg via ORAL
  Filled 2020-10-07 (×11): qty 1

## 2020-10-07 MED ORDER — HALOPERIDOL 0.5 MG PO TABS
0.5000 mg | ORAL_TABLET | ORAL | Status: DC | PRN
  Filled 2020-10-07: qty 1

## 2020-10-07 NOTE — Progress Notes (Signed)
Wesley Hunt   DOB:December 13, 1955   LD#:[id number]    ASSESSMENT & PLAN:  Stage IV metastatic lung cancer Respiratory failure The patient had significant stage IV metastatic lung cancer with diffuse lymphadenopathy, bony metastasis extensive collapse of the right lung with pleural involvement He had recurrent pleural effusion status post thoracentesis with minimal improvement to his breathing I told the patient and his sister that he is currently on maximum supportive care It is not likely that he will make it out of the hospital to complete his planned treatment The treatment goal is palliative in nature and non-curable The patient understood at present time, he is suffering and he does not want to prolong his suffering He is in agreement to stop aggressive care and focus on comfort measures His sister is willing to take him to her house and take care of him The patient's home is not in great condition and does not have air conditioner He would need continuous oxygen support for comfort in the palliative setting  Paroxysmal atrial fibrillation He is currently in sinus rhythm His echocardiogram result was reviewed and he had aortic stenosis as well I suspect he may have heart disease compounding on his symptoms We discussed the risk and benefits of aggressive cardiac care and he is in agreement to focus on comfort only  Code Status We discussed CODE STATUS and he is in agreement to change CODE STATUS to DNR  Goals of care The patient would like to focus on comfort only We will discontinue unnecessary medications I will initiate comfort care set Will consult social worker to get home-based palliative care with hospice services and oxygen supplies delivery to his sister house so that she can take care of him We discussed prognosis The patient would likely succumb to his disease in less than 3 months, possibly sooner if he develops arrhythmia  Discharge planning Likely Monday His sister  would like at least oxygen supplies delivered prior to excepting him to her house Primary service is informed with the plan of care I will cancel his outpatient follow-up appointment and inform his outpatient team of decision made today I will sign off Please call if questions arise  All questions were answered. The patient knows to call the clinic with any problems, questions or concerns.   The total time spent in the appointment was 40 minutes encounter with patients including review of chart and various tests results, discussions about plan of care and coordination of care plan  Heath Lark, MD 10/07/2020 2:20 PM  Subjective:  I was consulted by primary service to discuss goals of care with the patient The patient have extensive stage IV metastatic lung cancer and near complete collapse of the right side of his lung He was admitted for evaluation and management of significant shortness of breath at rest He had right thoracentesis of which 1.9 L of fluid was removed He has minimum improvement of his shortness of breath He was then found to have paroxysmal atrial fibrillation and had echocardiogram done which show moderate aortic stenosis and left ventricular hypertrophy At present time, he is somewhat comfortable but he needs to be sleeping at an incline position to be more comfortable He has significant orthopnea He has cancer associated pain but well controlled His sister is by the bedside  Objective:  Vitals:   10/07/20 0805 10/07/20 1100  BP: 113/62 115/77  Pulse: 93 95  Resp: 20 18  Temp: 98.2 F (36.8 C) 97.9 F (36.6 C)  SpO2:  92% 94%     Intake/Output Summary (Last 24 hours) at 10/07/2020 1420 Last data filed at 10/07/2020 0935 Gross per 24 hour  Intake 238 ml  Output 900 ml  Net -662 ml    GENERAL:alert, no distress and comfortable.  He has oxygen delivered via nasal cannula SKIN: skin color, texture, turgor are normal, no rashes or significant lesions EYES:  normal, Conjunctiva are pink and non-injected, sclera clear OROPHARYNX:no exudate, no erythema and lips, buccal mucosa, and tongue normal  NECK: supple, thyroid normal size, non-tender, without nodularity LYMPH:  no palpable lymphadenopathy in the cervical, axillary or inguinal LUNGS: Minimal breath sounds on the right.  Diffuse expiratory wheezes HEART: regular rate & rhythm left ejection systolic murmurs and no lower extremity edema ABDOMEN:abdomen soft, non-tender and normal bowel sounds Musculoskeletal:no cyanosis of digits and no clubbing  NEURO: alert & oriented x 3 with dysarthria, no focal motor/sensory deficits   Labs:  Recent Labs    10/03/20 1348 10/05/20 1306 10/06/20 0301  NA 135 133* 135  K 4.1 4.0 3.6  CL 97* 96* 96*  CO2 27 27 30   GLUCOSE 108* 100* 89  BUN 19 16 19   CREATININE 0.63 0.49* 0.54*  CALCIUM 9.8 9.2 9.0  GFRNONAA >60 >60 >60  PROT 7.5 6.8 6.2*  ALBUMIN 2.7* 2.5* 2.9*  AST 44* 38 37  ALT 23 25 21   ALKPHOS 192* 150* 127*  BILITOT 0.6 0.7 0.7    Studies: I have reviewed his PET CT scan and CXR DG Chest 1 View  Result Date: 10/05/2020 CLINICAL DATA:  Shortness of breath EXAM: CHEST  1 VIEW COMPARISON:  09/18/2020 FINDINGS: Complete opacification of the right hemithorax compatible with a combination of pleural effusion and complete right lung atelectasis. Diffuse interstitial opacities throughout the left lung. A skin fold is seen partially superimposed over the left lung apex. No evidence of pneumothorax. No pathologic fractures are seen. IMPRESSION: Complete opacification of the right hemithorax compatible with a combination of pleural effusion and complete right lung atelectasis. Findings slightly progressed from recent PET-CT. Electronically Signed   By: Davina Poke D.O.   On: 10/05/2020 12:33   NM PET Image Initial (PI) Skull Base To Thigh  Result Date: 09/20/2020 CLINICAL DATA:  Initial treatment strategy for lung nodule. 65 year old MALE EXAM:  NUCLEAR MEDICINE PET SKULL BASE TO THIGH TECHNIQUE: 8.6 mCi F-18 FDG was injected intravenously. Full-ring PET imaging was performed from the skull base to thigh after the radiotracer. CT data was obtained and used for attenuation correction and anatomic localization. Fasting blood glucose: 91 mg/dl COMPARISON:  Chest CT 08/14/2020. FINDINGS: Mediastinal blood pool activity: SUV max 1.4 Liver activity: SUV max NA NECK: Cluster of intensely hypermetabolic RIGHT supraclavicular nodes measuring 3.2 cm in mass with SUV max equal 9.1 (image 60). Incidental CT findings: none CHEST: Rim of intense hypermetabolic activity within the collapsed RIGHT middle lobe with SUV max equal 6.9. Hypermetabolic mass in the RIGHT suprahilar location with SUV max equal 10.1. There is partial collapse of the RIGHT upper lobe. Large RIGHT pleural effusion. Hypermetabolic nodularity in the medial pleural space of the RIGHT lower lobe SUV max equal 8.7 on image 128. Intense hypermetabolic RIGHT paratracheal adenopathy, subcarinal adenopathy and LEFT peribronchial adenopathy. Example LEFT peribronchial thickening SUV max equal 10.9. RIGHT tracheal nodes with SUV max equal 11.5 in measuring 2.5 cm in thickness. Hypermetabolic nodule in the LEFT upper lobe measuring 12 mm with SUV max equal 5.8. Incidental CT findings: none ABDOMEN/PELVIS: Extensive intense  hypermetabolic upper abdominal adenopathy. Large node ventral to the pancreas in the gastrohepatic ligament measures 2.1 cm in thickness (image 171) with SUV max equal 14.4. Hypermetabolic retroperitoneal para aortic nodes at the level the renal veins with SUV max equal 12.6 on image 190. Hypermetabolic adenopathy extends to a RIGHT external iliac lymph node with SUV max equal 11. No focal activity in the liver to suggest metastasis. Liver has a nodular contour. No adrenal metastasis Incidental CT findings: none SKELETON: Widespread intensely hypermetabolic skeletal metastasis. Example  sclerotic lesion the posterior RIGHT sacrum with SUV max equal 9.2. Intensely hypermetabolic lesion the posterior LEFT iliac bone with SUV max equal 10.9 and measuring 2 cm. These lesions are subtly sclerotic on the CT portion exam. Broad lesion involving the L4 vertebral body involves the near entire vertebral body SUV max equal 82.9 Hypermetabolic rib lesions additionally. Incidental CT findings: none IMPRESSION: 1. Difficult to determine primary lesion within the RIGHT lung with RIGHT middle lobe hypermetabolic mass and hypermetabolic RIGHT suprahilar mass. Extensive collapse of the RIGHT lower lobe, RIGHT middle lobe and partial collapse of the RIGHT upper lobe. Evidence of pleural metastasis in the RIGHT pleural space. 2. Extensive of hypermetabolic mediastinal and RIGHT supraclavicular nodal metastasis. 3. Metastatic nodule to the LEFT lung. 4. Extensive hypermetabolic metastatic adenopathy in the upper abdomen. 5. Widespread intensely skeletal metastasis involving the pelvis, spine and ribs. 6. Cirrhotic liver. Electronically Signed   By: Suzy Bouchard M.D.   On: 09/20/2020 10:33   DG CHEST PORT 1 VIEW  Result Date: 10/06/2020 CLINICAL DATA:  Pleural effusion. EXAM: PORTABLE CHEST 1 VIEW COMPARISON:  October 05, 2020. FINDINGS: Stable complete opacification of right hemithorax consistent with combination of effusion and atelectasis. Minimal left basilar subsegmental atelectasis is noted. No pneumothorax is noted. Bony thorax is unremarkable. IMPRESSION: Stable complete opacification of right hemithorax is noted consistent with combination of pleural effusion and atelectasis. Electronically Signed   By: Marijo Conception M.D.   On: 10/06/2020 13:04   DG Chest Port 1 View  Result Date: 10/05/2020 CLINICAL DATA:  Status post right thoracentesis with removal of 1.9 L of fluid. Prior imaging has demonstrated evidence of extensive hypermetabolic tumor in the right chest with metastatic lymphadenopathy. EXAM:  PORTABLE CHEST 1 VIEW COMPARISON:  PET scan on 09/18/2020 FINDINGS: After thoracentesis there is a minimal amount of aeration of the right upper lung with residual dense consolidation of the right lung and probably some residual pleural fluid remaining. Findings are consistent with obstructive tumor centrally which is not allowing much aeration to occur of the underlying lung. No pneumothorax. Expansion and aeration of the left lung is improved compared to the chest x-ray earlier today. IMPRESSION: Minimal aeration of the right upper lung after large volume right-sided thoracentesis. Findings are consistent with known obstructive tumor centrally which is not allowing much aeration to occur of the right lung. Left lung expansion has improved. No pneumothorax. Electronically Signed   By: Aletta Edouard M.D.   On: 10/05/2020 17:15   ECHOCARDIOGRAM COMPLETE  Result Date: 10/06/2020    ECHOCARDIOGRAM REPORT   Patient Name:   ANH MANGANO Date of Exam: 10/06/2020 Medical Rec #:  [id number]      Height:       67.0 in Accession #:    [id number]     Weight:       145.9 lb Date of Birth:  [birth year]      BSA:  1.769 m Patient Age:    26 years       BP:           163/91 mmHg Patient Gender: M              HR:           88 bpm. Exam Location:  Inpatient Procedure: 2D Echo, 3D Echo, Cardiac Doppler and Color Doppler Indications:    I50.40* Unspecified combined systolic (congestive) and diastolic                 (congestive) heart failure  History:        Patient has no prior history of Echocardiogram examinations.                 Abnormal ECG, COPD, Arrythmias:Atrial Fibrillation,                 Signs/Symptoms:Shortness of Breath and Dyspnea; Risk                 Factors:Current Smoker. Cancer. Drug use. Pleural effusion. Post                 thoracentesis.  Sonographer:    Roseanna Rainbow RDCS Referring Phys: [phone] [facility]  Sonographer Comments: Patient in pain and moving throughout test. IMPRESSIONS  1. Left  ventricular ejection fraction, by estimation, is 65 to 70%. Left ventricular ejection fraction by 3D volume is 62 %. The left ventricle has normal function. The left ventricle has no regional wall motion abnormalities. There is moderate concentric left ventricular hypertrophy. Left ventricular diastolic parameters are consistent with Grade I diastolic dysfunction (impaired relaxation). Elevated left ventricular end-diastolic pressure.  2. Right ventricular systolic function was not well visualized. The right ventricular size is normal. Tricuspid regurgitation signal is inadequate for assessing PA pressure.  3. The mitral valve is normal in structure. Mild mitral valve regurgitation. No evidence of mitral stenosis.  4. The aortic valve is calcified. There is severe calcifcation of the aortic valve. There is severe thickening of the aortic valve. Aortic valve regurgitation is not visualized. Mild to moderate aortic valve stenosis. Aortic valve area, by VTI measures 1.52 cm. Aortic valve mean gradient measures 19.0 mmHg. Aortic valve Vmax measures 2.87 m/s.  5. Aortic dilatation noted. There is borderline dilatation of the ascending aorta, measuring 37 mm.  6. The inferior vena cava is dilated in size with >50% respiratory variability, suggesting right atrial pressure of 8 mmHg. FINDINGS  Left Ventricle: Left ventricular ejection fraction, by estimation, is 65 to 70%. Left ventricular ejection fraction by 3D volume is 62 %. The left ventricle has normal function. The left ventricle has no regional wall motion abnormalities. The left ventricular internal cavity size was normal in size. There is moderate concentric left ventricular hypertrophy. Left ventricular diastolic parameters are consistent with Grade I diastolic dysfunction (impaired relaxation). Elevated left ventricular end-diastolic pressure. Right Ventricle: The right ventricular size is normal. No increase in right ventricular wall thickness. Right  ventricular systolic function was not well visualized. Tricuspid regurgitation signal is inadequate for assessing PA pressure. Left Atrium: Left atrial size was normal in size. Right Atrium: Right atrial size was normal in size. Pericardium: There is no evidence of pericardial effusion. Mitral Valve: The mitral valve is normal in structure. Mild mitral annular calcification. Mild mitral valve regurgitation. No evidence of mitral valve stenosis. Tricuspid Valve: The tricuspid valve is normal in structure. Tricuspid valve regurgitation is trivial. No evidence of tricuspid stenosis.  Aortic Valve: The aortic valve is calcified. There is severe calcifcation of the aortic valve. There is severe thickening of the aortic valve. Aortic valve regurgitation is not visualized. Mild to moderate aortic stenosis is present. Aortic valve mean gradient measures 19.0 mmHg. Aortic valve peak gradient measures 32.9 mmHg. Aortic valve area, by VTI measures 1.52 cm. Pulmonic Valve: The pulmonic valve was normal in structure. Pulmonic valve regurgitation is not visualized. No evidence of pulmonic stenosis. Aorta: Aortic dilatation noted. There is borderline dilatation of the ascending aorta, measuring 37 mm. Venous: The inferior vena cava is dilated in size with greater than 50% respiratory variability, suggesting right atrial pressure of 8 mmHg. IAS/Shunts: The interatrial septum appears to be lipomatous. No atrial level shunt detected by color flow Doppler.  LEFT VENTRICLE PLAX 2D LVIDd:         3.60 cm         Diastology LVIDs:         2.20 cm         LV e' medial:    5.33 cm/s LV PW:         1.40 cm         LV E/e' medial:  19.7 LV IVS:        1.50 cm         LV e' lateral:   6.31 cm/s LVOT diam:     2.00 cm         LV E/e' lateral: 16.6 LV SV:         97 LV SV Index:   55 LVOT Area:     3.14 cm        3D Volume EF                                LV 3D EF:    Left                                             ventricular LV Volumes  (MOD)                            ejection LV vol d, MOD    72.9 ml                    fraction by A2C:                                        3D volume LV vol d, MOD    66.7 ml                    is 62 %. A4C: LV vol s, MOD    20.0 ml A2C:                           3D Volume EF: LV vol s, MOD    23.3 ml       3D EF:        62 % A4C: LV SV MOD A2C:   52.9 ml LV SV MOD A4C:   66.7 ml LV SV MOD BP:    50.1 ml  RIGHT VENTRICLE             IVC RV S prime:     15.90 cm/s  IVC diam: 2.10 cm TAPSE (M-mode): 2.7 cm LEFT ATRIUM             Index       RIGHT ATRIUM          Index LA diam:        3.20 cm 1.81 cm/m  RA Area:     6.45 cm LA Vol (A2C):   27.5 ml 15.55 ml/m RA Volume:   8.04 ml  4.55 ml/m LA Vol (A4C):   27.6 ml 15.60 ml/m LA Biplane Vol: 27.5 ml 15.55 ml/m  AORTIC VALVE AV Area (Vmax):    1.80 cm AV Area (Vmean):   1.61 cm AV Area (VTI):     1.52 cm AV Vmax:           287.00 cm/s AV Vmean:          209.000 cm/s AV VTI:            0.641 m AV Peak Grad:      32.9 mmHg AV Mean Grad:      19.0 mmHg LVOT Vmax:         164.00 cm/s LVOT Vmean:        107.250 cm/s LVOT VTI:          0.310 m LVOT/AV VTI ratio: 0.48  AORTA Ao Root diam: 3.00 cm Ao Asc diam:  3.70 cm MITRAL VALVE MV Area (PHT): 4.68 cm     SHUNTS MV Decel Time: 162 msec     Systemic VTI:  0.31 m MV E velocity: 105.00 cm/s  Systemic Diam: 2.00 cm MV A velocity: 145.00 cm/s MV E/A ratio:  0.72 Fransico Him MD Electronically signed by Fransico Him MD Signature Date/Time: 10/06/2020/12:25:34 PM    Final    IR THORACENTESIS ASP PLEURAL SPACE W/IMG GUIDE  Result Date: 10/05/2020 INDICATION: Right pleural effusion, shortness of breast. Request for therapeutic and diagnostic thoracentesis. EXAM: ULTRASOUND GUIDED RIGHT THORACENTESIS MEDICATIONS: 10 mL 1% lidocaine COMPLICATIONS: None immediate. PROCEDURE: An ultrasound guided thoracentesis was thoroughly discussed with the patient and questions answered. The benefits, risks, alternatives and complications  were also discussed. The patient understands and wishes to proceed with the procedure. Written consent was obtained. Ultrasound was performed to localize and mark an adequate pocket of fluid in the right chest. The area was then prepped and draped in the normal sterile fashion. 1% Lidocaine was used for local anesthesia. Under ultrasound guidance a 6 Fr Safe-T-Centesis catheter was introduced. Thoracentesis was performed. The catheter was removed and a dressing applied. FINDINGS: A total of approximately 1.9 L of clear amber fluid was removed. Samples were sent to the laboratory as requested by the clinical team. Postprocedural chest x-ray ordered, pending. IMPRESSION: Successful ultrasound guided right thoracentesis yielding 1.9 L of pleural fluid. Read by: Durenda Guthrie, PA-C Electronically Signed   By: Aletta Edouard M.D.   On: 10/05/2020 16:58

## 2020-10-07 NOTE — Consult Note (Signed)
Palliative Medicine Inpatient Consult Note  Reason for consult:  "symptom management and goals of care--please see tomorrow if possible to consolidate discussions already started-"  HPI:  Per intake H&P --> 65 year old male with a PMHx: metastatic right lung cancer complicated by large right-sided pleural effusion-final diagnosis still not clarified as new diagnosis followed by Dr. Earlie Server, moderate COPD continued smoker, hepatitis C complicated by cirrhosis, new colon cancer followed by GI, presumed aortic stenosis, tuberculosis in the 1970s. Evaluated by Oncology this afternoon - vocalized his cancer is beyond the point of cure. Patient shared that he is suffering - Oncology made him a DNR and switched focus to comfort oriented care.  Palliative care is involved to aid in symptom management.   Clinical Assessment/Goals of Care:  *Please note that this is a verbal dictation therefore any spelling or grammatical errors are due to the "[place] One" system interpretation.  I have reviewed medical records including EPIC notes, labs and imaging, received report from bedside RN, assessed the patient who is sitting upright in bed, slight tachypnea.    I met with Wesley Hunt to further discuss diagnosis prognosis, GOC, EOL wishes, disposition and options.   I introduced Palliative Medicine as specialized medical care for people living with serious illness. It focuses on providing relief from the symptoms and stress of a serious illness. The goal is to improve quality of life for both the patient and the family.  Wesley Hunt shares with me that he is from [place], [place]. He has never been married and does not have any children. He has a sister, [name] and a brother, Stark Bray who live locally. 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 He enjoys golfing for recreation. He is not overtly faithful.   Prior to hospitalization Epic had been gradually weakening. He shares he was still  to the best of his abilities trying to complete bADLs.   Wesley Hunt states that the doctors told him "I got no lung". I asked him what he meant by this and he showed me an XRAY on his telephone. He shares that he is aware of his lung cancer and that it has progressed beyond the point of treatment. He shares that he is at terms with this as, "we all gotta go sometime."  A detailed discussion was had today regarding advanced directives - patient does not have any on file though he shares with me that he would wish for his sister, [name] to make decisions for him if he were ever incapacitated.  Concepts specific to code status, artifical feeding and hydration, continued IV antibiotics and rehospitalization was had.  A MOST form was completed as below:  Cardiopulmonary Resuscitation: Do Not Attempt Resuscitation (DNR/No CPR)  Medical Interventions: Comfort Measures: Keep clean, warm, and dry. Use medication by any route, positioning, wound care, and other measures to relieve pain and suffering. Use oxygen, suction and manual treatment of airway obstruction as needed for comfort. Do not transfer to the hospital unless comfort needs cannot be met in current location.  Antibiotics: No antibiotics (use other measures to relieve symptoms)  IV Fluids: No IV fluids (provide other measures to ensure comfort)  Feeding Tube: No feeding tube   The difference between a aggressive medical intervention path  and a palliative comfort care path for this patient at this time was had. We talked about transition to comfort measures in house and what that would entail inclusive of medications to control pain, dyspnea, agitation, nausea, itching,  and hiccups.  We discussed stopping all uneccessary measures such as blood draws, needle sticks, and frequent vital signs. Utilized reflective listening throughout our time together.   Wesley Hunt shares with me that he needs to "get right with the Lord". I asked if I may request our chaplain stop  by to aid in this which he declines. He shares that he has someone in mind of his own who he would prefer to speak with.   Discussed the importance of continued conversation with family and their  medical providers regarding overall plan of care and treatment options, ensuring decisions are within the context of the patients values and GOCs.  Decision Maker: Wesley Hunt (sister) [phone]  SUMMARY OF RECOMMENDATIONS   DNAR/DNI  Comfort Care  Unrestricted Visitation  Medications per [facility]  [facility] consult for home  Ongoing PMT support  Code Status/Advance Care Planning: DNAR/DNI    Palliative Prophylaxis:  Oral Care, Mobility, Pain  Additional Recommendations (Limitations, Scope, Preferences): Comfort care   Psycho-social/Spiritual:  Desire for further Chaplaincy support: No Additional Recommendations: Education on lung cancer prognosis.    Prognosis: Home hospice is appropriate  Discharge Planning: Discharge to home with hospice.  Vitals:   10/07/20 0805 10/07/20 1100  BP: 113/62 115/77  Pulse: 93 95  Resp: 20 18  Temp: 98.2 F (36.8 C) 97.9 F (36.6 C)  SpO2: 92% 94%    Intake/Output Summary (Last 24 hours) at 10/07/2020 1458 Last data filed at 10/07/2020 0935 Gross per 24 hour  Intake 238 ml  Output 900 ml  Net -662 ml   Last Weight  Most recent update: 10/05/2020  1:05 PM    Weight  66.2 kg (145 lb 15.1 oz)            Gen:  Older caucasian M in slight distress HEENT: moist mucous membranes CV: Regular rate and rhythm  PULM: On 2LPM, tachypnea ABD: soft/nontender  EXT: No edema  Neuro: Alert and oriented x3   PPS: 40-50%   This conversation/these recommendations were discussed with patient primary care team, Dr. Verlon Au  Time In: 1500 Time Out: 1610 Total Time: 70 Greater than 50%  of this time was spent counseling and coordinating care related to the above assessment and plan.  [place]  Team Team Cell Phone: [phone] Please utilize secure chat with additional questions, if there is no response within 30 minutes please call the above phone number  Palliative Medicine Team providers are available by phone from 7am to 7pm daily and can be reached through the team cell phone.  Should this patient require assistance outside of these hours, please call the patient's attending physician.

## 2020-10-07 NOTE — TOC Initial Note (Signed)
Transition of Care [facility]) - Initial/Assessment Note    Patient Details  Name: Wesley Hunt MRN: [id number] Date of Birth: 07-29-1955  Transition of Care [facility]) CM/SW Contact:    Bartholomew Crews, RN Phone Number: [phone] 10/07/2020, 5:26 PM  Clinical Narrative:                  Notified of need for hospice referral. Spoke with patient's sister, [name]. Doneen Poisson stated that she lives in mobile home at [address] [place], [place]. She stated that she also has a brother in [facility], but not sure if patient is able to stay with her brother or not. She stated that her brother is out of town until Monday. She is not sure what she will need, but thinks probably a hospital bed, over bed table, oxygen, bedside commode, and wheelchair. Advised that hospice will assess for these needs as well.   Discussed choice of hospice agency. She is interested in hospice home when patient is ready. Referral to [facility] - acceptance pending. Clinical notes faxed to [phone].   Due to weakness and oxygen needs, patient will likely need ambulance transport at time of discharge.   TOC following.  Expected Discharge Plan: [place]     Patient Goals and CMS Choice        Expected Discharge Plan and Services Expected Discharge Plan: [place]                                              Prior Living Arrangements/Services                       Activities of Daily Living      Permission Sought/Granted                  Emotional Assessment              Admission diagnosis:  Pleural effusion [J90] Status post thoracentesis [Z98.890] Patient Active Problem List   Diagnosis Date Noted   Pleural effusion 10/05/2020   Malignant neoplasm of upper lobe of right lung ([place]) 10/03/2020   Positive colorectal cancer screening using Cologuard test 09/18/2020   Abnormal liver diagnostic imaging 09/18/2020   PCP:  Glenda Chroman, MD Pharmacy:    [id number] [place], [place] [address] [place] [address] Phone: [phone] Fax: [phone]  [facility] [place] [place], [place] [address] [place] [address] Phone: [phone] Fax: [phone]     Social Determinants of Health (SDOH) Interventions    Readmission Risk Interventions No flowsheet data found.

## 2020-10-07 NOTE — Progress Notes (Signed)
Pt went into afib this am EKG confirmed, non symptomatic. Converted back to NSR. Again went back into Afib, MD notified. VSS, will continue to monitor.   Chrisandra Carota, RN

## 2020-10-07 NOTE — Progress Notes (Signed)
PROGRESS NOTE   GERMANY CHELF  JJO:[id number] DOB: 05-30-1955 DOA: 10/05/2020 PCP: Glenda Chroman, MD  Brief Narrative: 65 year old male Metastatic right lung cancer complicated by large right-sided pleural effusion-final diagnosis still not clarified as new diagnosis followed by Dr. Earlie Server Moderate COPD continued smoker Hepatitis C complicated by cirrhosis ?  New colon cancer followed by GI Presumed aortic stenosis Tuberculosis in the 1970s  Patient admitted from interventional radiology suite 10/05/2020 SOB with background progressive dyspnea over the past 6 weeks  Found to have white out lung right side IR consulted-thoracentesis performed Oncology consulted, XRT consulted should be transferred to [facility] 10/08/2020 for consideration of XRT commencement under Dr. Lisbeth Renshaw  Hospital-Problem based course  Acute hypoxic respiratory failure 2/2 large right-sided pleural effusion + metastatic cancer---presumed lung primary Episodic desaturation whenever patient lays flat Chest x-ray repeat 6/11 shows persistence of mass, fluid with complete opacification of right hemithorax Continue Zosyn at this time and de-escalate in the next several days Paroxysmal A. fib recurring 10/07/2020 CHADS2 score >3 Start Cardizem 30 p.o. Q8-adjust as needed in the next 24 hours-if heart rate sustains over 120 will need to start gtt. Hold anticoagulation for now--if in the next 24-hour does not resolve we will need to start heparin Suspect related to underlying metastatic disease Metastatic lung disease--extensive as per last note by Dr. Earlie Server 6/7 Therapeutic versus palliative trajectory discussed with patient and sister at bedside [name] Patient is undecided regarding CODE STATUS Will oncology to talk to him about options Severe aortic stenosis calcification with mild to moderate stenosis valve area 1.5 Keep volumes even today, hold fluids-consider in the next 24 hours Lasix 20 mg daily if blood  pressure remains stable By Lights criterion, Fluid Exudative, likely in keeping c malignancy Moderate COPD still smoker Nicotine patch Cirrhosis --etiology unclear HCVRNA was negative so I am presuming this was a hep B I will get hep B titers  DVT prophylaxis: SCD Code Status: Full code confirmed at the bedside Family Communication: Discussed with Lesle Chris at the bedside-her phone number [phone]  We spent another 15 minutes today in care coordination discussions  Disposition:  Status is: Inpatient  Remains inpatient appropriate because:Hemodynamically unstable, Unsafe d/c plan, and IV treatments appropriate due to intensity of illness or inability to take PO  Dispo: The patient is from: Home              Anticipated d/c is to:  Unclear at this time              Patient currently is not medically stable to d/c.   Difficult to place patient No  Consultants:  Multiple  Procedures  Antimicrobials:   Zosyn since 6/9   Subjective:  Feels good no pain Not diaphoretic However has flipped into A. fib with RVR which is now under better control with Cardizem 3 times daily coherent pleasant on oxygen   Objective: Vitals:   10/06/20 2311 10/07/20 0407 10/07/20 0805 10/07/20 1100  BP: (!) 153/88 (!) 164/102 113/62 115/77  Pulse: 95 91 93 95  Resp: 20 18 20 18   Temp: 98.4 F (36.9 C) 98.2 F (36.8 C) 98.2 F (36.8 C) 97.9 F (36.6 C)  TempSrc: Oral Oral Oral Oral  SpO2: 95% 90% 92% 94%  Weight:      Height:        Intake/Output Summary (Last 24 hours) at 10/07/2020 1336 Last data filed at 10/07/2020 0935 Gross per 24 hour  Intake 238 ml  Output 900  ml  Net -662 ml    Filed Weights   10/05/20 1305  Weight: 66.2 kg    Examination:  Coherent and pleasant but is on oxygen Increased percussion note right posterolateral lung fields J8-A4 oRUSB holosystolic murmur A. fib on monitors rate controlled in the 110 range Abdomen soft no rebound no guarding No  lower extremity edema mucosas little bit dry Neurologically intact moving all 4 limbs without deficit  Data Reviewed: personally reviewed   CBC    Component Value Date/Time   WBC 16.2 (H) 10/06/2020 0301   RBC 4.00 (L) 10/06/2020 0301   HGB 12.3 (L) 10/06/2020 0301   HGB 13.5 10/03/2020 1348   HCT 37.3 (L) 10/06/2020 0301   PLT 331 10/06/2020 0301   PLT 404 (H) 10/03/2020 1348   MCV 93.3 10/06/2020 0301   MCH 30.8 10/06/2020 0301   MCHC 33.0 10/06/2020 0301   RDW 13.9 10/06/2020 0301   LYMPHSABS 0.6 (L) 10/05/2020 1306   MONOABS 0.8 10/05/2020 1306   EOSABS 0.0 10/05/2020 1306   BASOSABS 0.0 10/05/2020 1306   CMP Latest Ref Rng & Units 10/06/2020 10/05/2020 10/03/2020  Glucose 70 - 99 mg/dL 89 100(H) 108(H)  BUN 8 - 23 mg/dL 19 16 19   Creatinine 0.61 - 1.24 mg/dL 0.54(L) 0.49(L) 0.63  Sodium 135 - 145 mmol/L 135 133(L) 135  Potassium 3.5 - 5.1 mmol/L 3.6 4.0 4.1  Chloride 98 - 111 mmol/L 96(L) 96(L) 97(L)  CO2 22 - 32 mmol/L 30 27 27   Calcium 8.9 - 10.3 mg/dL 9.0 9.2 9.8  Total Protein 6.5 - 8.1 g/dL 6.2(L) 6.8 7.5  Total Bilirubin 0.3 - 1.2 mg/dL 0.7 0.7 0.6  Alkaline Phos 38 - 126 U/L 127(H) 150(H) 192(H)  AST 15 - 41 U/L 37 38 44(H)  ALT 0 - 44 U/L 21 25 23      Radiology Studies: DG CHEST PORT 1 VIEW  Result Date: 10/06/2020 CLINICAL DATA:  Pleural effusion. EXAM: PORTABLE CHEST 1 VIEW COMPARISON:  October 05, 2020. FINDINGS: Stable complete opacification of right hemithorax consistent with combination of effusion and atelectasis. Minimal left basilar subsegmental atelectasis is noted. No pneumothorax is noted. Bony thorax is unremarkable. IMPRESSION: Stable complete opacification of right hemithorax is noted consistent with combination of pleural effusion and atelectasis. Electronically Signed   By: Marijo Conception M.D.   On: 10/06/2020 13:04   DG Chest Port 1 View  Result Date: 10/05/2020 CLINICAL DATA:  Status post right thoracentesis with removal of 1.9 L of fluid. Prior  imaging has demonstrated evidence of extensive hypermetabolic tumor in the right chest with metastatic lymphadenopathy. EXAM: PORTABLE CHEST 1 VIEW COMPARISON:  PET scan on 09/18/2020 FINDINGS: After thoracentesis there is a minimal amount of aeration of the right upper lung with residual dense consolidation of the right lung and probably some residual pleural fluid remaining. Findings are consistent with obstructive tumor centrally which is not allowing much aeration to occur of the underlying lung. No pneumothorax. Expansion and aeration of the left lung is improved compared to the chest x-ray earlier today. IMPRESSION: Minimal aeration of the right upper lung after large volume right-sided thoracentesis. Findings are consistent with known obstructive tumor centrally which is not allowing much aeration to occur of the right lung. Left lung expansion has improved. No pneumothorax. Electronically Signed   By: Aletta Edouard M.D.   On: 10/05/2020 17:15   ECHOCARDIOGRAM COMPLETE  Result Date: 10/06/2020    ECHOCARDIOGRAM REPORT   Patient Name:   Wesley Hunt Date of Exam: 10/06/2020 Medical Rec #:  [id number]      Height:       67.0 in Accession #:    [id number]     Weight:       145.9 lb Date of Birth:  [birth year]      BSA:          1.769 m Patient Age:    78 years       BP:           163/91 mmHg Patient Gender: M              HR:           88 bpm. Exam Location:  Inpatient Procedure: 2D Echo, 3D Echo, Cardiac Doppler and Color Doppler Indications:    I50.40* Unspecified combined systolic (congestive) and diastolic                 (congestive) heart failure  History:        Patient has no prior history of Echocardiogram examinations.                 Abnormal ECG, COPD, Arrythmias:Atrial Fibrillation,                 Signs/Symptoms:Shortness of Breath and Dyspnea; Risk                 Factors:Current Smoker. Cancer. Drug use. Pleural effusion. Post                 thoracentesis.  Sonographer:    Roseanna Rainbow RDCS  Referring Phys: [phone] [facility]  Sonographer Comments: Patient in pain and moving throughout test. IMPRESSIONS  1. Left ventricular ejection fraction, by estimation, is 65 to 70%. Left ventricular ejection fraction by 3D volume is 62 %. The left ventricle has normal function. The left ventricle has no regional wall motion abnormalities. There is moderate concentric left ventricular hypertrophy. Left ventricular diastolic parameters are consistent with Grade I diastolic dysfunction (impaired relaxation). Elevated left ventricular end-diastolic pressure.  2. Right ventricular systolic function was not well visualized. The right ventricular size is normal. Tricuspid regurgitation signal is inadequate for assessing PA pressure.  3. The mitral valve is normal in structure. Mild mitral valve regurgitation. No evidence of mitral stenosis.  4. The aortic valve is calcified. There is severe calcifcation of the aortic valve. There is severe thickening of the aortic valve. Aortic valve regurgitation is not visualized. Mild to moderate aortic valve stenosis. Aortic valve area, by VTI measures 1.52 cm. Aortic valve mean gradient measures 19.0 mmHg. Aortic valve Vmax measures 2.87 m/s.  5. Aortic dilatation noted. There is borderline dilatation of the ascending aorta, measuring 37 mm.  6. The inferior vena cava is dilated in size with >50% respiratory variability, suggesting right atrial pressure of 8 mmHg. FINDINGS  Left Ventricle: Left ventricular ejection fraction, by estimation, is 65 to 70%. Left ventricular ejection fraction by 3D volume is 62 %. The left ventricle has normal function. The left ventricle has no regional wall motion abnormalities. The left ventricular internal cavity size was normal in size. There is moderate concentric left ventricular hypertrophy. Left ventricular diastolic parameters are consistent with Grade I diastolic dysfunction (impaired relaxation). Elevated left ventricular end-diastolic  pressure. Right Ventricle: The right ventricular size is normal. No increase in right ventricular wall thickness. Right ventricular systolic function was not well visualized. Tricuspid regurgitation signal is inadequate for assessing PA pressure. Left Atrium: Left atrial  size was normal in size. Right Atrium: Right atrial size was normal in size. Pericardium: There is no evidence of pericardial effusion. Mitral Valve: The mitral valve is normal in structure. Mild mitral annular calcification. Mild mitral valve regurgitation. No evidence of mitral valve stenosis. Tricuspid Valve: The tricuspid valve is normal in structure. Tricuspid valve regurgitation is trivial. No evidence of tricuspid stenosis. Aortic Valve: The aortic valve is calcified. There is severe calcifcation of the aortic valve. There is severe thickening of the aortic valve. Aortic valve regurgitation is not visualized. Mild to moderate aortic stenosis is present. Aortic valve mean gradient measures 19.0 mmHg. Aortic valve peak gradient measures 32.9 mmHg. Aortic valve area, by VTI measures 1.52 cm. Pulmonic Valve: The pulmonic valve was normal in structure. Pulmonic valve regurgitation is not visualized. No evidence of pulmonic stenosis. Aorta: Aortic dilatation noted. There is borderline dilatation of the ascending aorta, measuring 37 mm. Venous: The inferior vena cava is dilated in size with greater than 50% respiratory variability, suggesting right atrial pressure of 8 mmHg. IAS/Shunts: The interatrial septum appears to be lipomatous. No atrial level shunt detected by color flow Doppler.  LEFT VENTRICLE PLAX 2D LVIDd:         3.60 cm         Diastology LVIDs:         2.20 cm         LV e' medial:    5.33 cm/s LV PW:         1.40 cm         LV E/e' medial:  19.7 LV IVS:        1.50 cm         LV e' lateral:   6.31 cm/s LVOT diam:     2.00 cm         LV E/e' lateral: 16.6 LV SV:         97 LV SV Index:   55 LVOT Area:     3.14 cm        3D Volume EF                                 LV 3D EF:    Left                                             ventricular LV Volumes (MOD)                            ejection LV vol d, MOD    72.9 ml                    fraction by A2C:                                        3D volume LV vol d, MOD    66.7 ml                    is 62 %. A4C: LV vol s, MOD    20.0 ml A2C:  3D Volume EF: LV vol s, MOD    23.3 ml       3D EF:        62 % A4C: LV SV MOD A2C:   52.9 ml LV SV MOD A4C:   66.7 ml LV SV MOD BP:    50.1 ml RIGHT VENTRICLE             IVC RV S prime:     15.90 cm/s  IVC diam: 2.10 cm TAPSE (M-mode): 2.7 cm LEFT ATRIUM             Index       RIGHT ATRIUM          Index LA diam:        3.20 cm 1.81 cm/m  RA Area:     6.45 cm LA Vol (A2C):   27.5 ml 15.55 ml/m RA Volume:   8.04 ml  4.55 ml/m LA Vol (A4C):   27.6 ml 15.60 ml/m LA Biplane Vol: 27.5 ml 15.55 ml/m  AORTIC VALVE AV Area (Vmax):    1.80 cm AV Area (Vmean):   1.61 cm AV Area (VTI):     1.52 cm AV Vmax:           287.00 cm/s AV Vmean:          209.000 cm/s AV VTI:            0.641 m AV Peak Grad:      32.9 mmHg AV Mean Grad:      19.0 mmHg LVOT Vmax:         164.00 cm/s LVOT Vmean:        107.250 cm/s LVOT VTI:          0.310 m LVOT/AV VTI ratio: 0.48  AORTA Ao Root diam: 3.00 cm Ao Asc diam:  3.70 cm MITRAL VALVE MV Area (PHT): 4.68 cm     SHUNTS MV Decel Time: 162 msec     Systemic VTI:  0.31 m MV E velocity: 105.00 cm/s  Systemic Diam: 2.00 cm MV A velocity: 145.00 cm/s MV E/A ratio:  0.72 Fransico Him MD Electronically signed by Fransico Him MD Signature Date/Time: 10/06/2020/12:25:34 PM    Final    IR THORACENTESIS ASP PLEURAL SPACE W/IMG GUIDE  Result Date: 10/05/2020 INDICATION: Right pleural effusion, shortness of breast. Request for therapeutic and diagnostic thoracentesis. EXAM: ULTRASOUND GUIDED RIGHT THORACENTESIS MEDICATIONS: 10 mL 1% lidocaine COMPLICATIONS: None immediate. PROCEDURE: An ultrasound guided thoracentesis  was thoroughly discussed with the patient and questions answered. The benefits, risks, alternatives and complications were also discussed. The patient understands and wishes to proceed with the procedure. Written consent was obtained. Ultrasound was performed to localize and mark an adequate pocket of fluid in the right chest. The area was then prepped and draped in the normal sterile fashion. 1% Lidocaine was used for local anesthesia. Under ultrasound guidance a 6 Fr Safe-T-Centesis catheter was introduced. Thoracentesis was performed. The catheter was removed and a dressing applied. FINDINGS: A total of approximately 1.9 L of clear amber fluid was removed. Samples were sent to the laboratory as requested by the clinical team. Postprocedural chest x-ray ordered, pending. IMPRESSION: Successful ultrasound guided right thoracentesis yielding 1.9 L of pleural fluid. Read by: Durenda Guthrie, PA-C Electronically Signed   By: Aletta Edouard M.D.   On: 10/05/2020 16:58     Scheduled Meds:  albuterol  2.5 mg Nebulization BID   diltiazem  30 mg Oral Q6H  fentaNYL  1 patch Transdermal Q3 days   nicotine  14 mg Transdermal Daily   Continuous Infusions:  piperacillin-tazobactam (ZOSYN)  IV 3.375 g (10/07/20 0924)     LOS: 2 days   Time spent: 18  Nita Sells, MD [facility] To contact the attending provider between 7A-7P or the covering provider during after hours 7P-7A, please log into the web site [email or web address] and access using universal [place] password for that web site. If you do not have the password, please call the hospital operator.  10/07/2020, 1:36 PM

## 2020-10-08 DIAGNOSIS — J90 Pleural effusion, not elsewhere classified: Secondary | ICD-10-CM | POA: Diagnosis not present

## 2020-10-08 DIAGNOSIS — Z7189 Other specified counseling: Secondary | ICD-10-CM | POA: Diagnosis not present

## 2020-10-08 DIAGNOSIS — Z515 Encounter for palliative care: Secondary | ICD-10-CM | POA: Diagnosis not present

## 2020-10-08 MED ORDER — FENTANYL 50 MCG/HR TD PT72: 1.0000 | MEDICATED_PATCH | TRANSDERMAL | 0 refills | Status: DC

## 2020-10-08 MED ORDER — ALBUTEROL SULFATE (2.5 MG/3ML) 0.083% IN NEBU: 2.5000 mg | INHALATION_SOLUTION | RESPIRATORY_TRACT | 12 refills | Status: DC | PRN

## 2020-10-08 MED ORDER — ALBUTEROL SULFATE (2.5 MG/3ML) 0.083% IN NEBU: 2.5000 mg | INHALATION_SOLUTION | RESPIRATORY_TRACT | Status: DC | PRN

## 2020-10-08 MED ORDER — HALOPERIDOL LACTATE 2 MG/ML PO CONC: 0.5000 mg | ORAL | 0 refills | Status: DC | PRN

## 2020-10-08 MED ORDER — ONDANSETRON 4 MG PO TBDP: 4.0000 mg | ORAL_TABLET | Freq: Four times a day (QID) | ORAL | 0 refills | Status: DC | PRN

## 2020-10-08 NOTE — Progress Notes (Signed)
Patient seen and examined today 6/12 and is stabilized for discharge to home with hospice with his Sister Carleene Overlie details to be consolidated by [facility] tomorrow in addition to delivery of durable equipment  I will update discharge summary tomorrow No charge  Verneita Griffes, MD [facility] 5:55 PM

## 2020-10-08 NOTE — Discharge Summary (Signed)
Physician Discharge Summary  DELAINE Hunt YDX:[id number] DOB: 09-28-1955 DOA: 10/05/2020  PCP: Glenda Chroman, MD  Admit date: 10/05/2020 Discharge date: 10/08/2020  Time spent: 37 minutes  Recommendations for Outpatient Follow-up:  Patient discharging home with home hospice  Discharge Diagnoses:  MAIN problem for hospitalization   Hypoxic respiratory failure secondary to large intractable uncurable pleural effusion with metastatic lung cancer  Please see below for itemized issues addressed in HOpsital- refer to other progress notes for clarity if needed  Discharge Condition: Guarded  Diet recommendation: Regular  Filed Weights   10/05/20 1305  Weight: 66.2 kg    History of present illness:  65 year old male Metastatic right lung cancer complicated by large right-sided pleural effusion-final diagnosis still not clarified as new diagnosis followed by Dr. Earlie Server Moderate COPD continued smoker Hepatitis C complicated by cirrhosis ?  New colon cancer followed by GI Presumed aortic stenosis Tuberculosis in the 1970s   Patient admitted from interventional radiology suite 10/05/2020 SOB with background progressive dyspnea over the past 6 weeks  Found to have white out lung right side IR consulted-thoracentesis performed Oncology consulted, XRT consulted should be transferred to [facility] 10/08/2020 for consideration of XRT commencement under Dr. Myer Peer Course:  Acute hypoxic respiratory failure 2/2 large right-sided pleural effusion + metastatic cancer---presumed lung primary Episodic desaturation whenever patient lays flat Patient was on Zosyn but this was de-escalated as patient is now full comfort Paroxysmal A. fib recurring 10/07/2020 CHADS2 score >3 Flipped into paroxysmal A. fib but now is full comfort discontinued all rate control at this discharge Metastatic lung disease--extensive as per last note by Dr. Earlie Server 6/7 Therapeutic versus palliative  trajectory discussed with patient and sister at bedside [name] Extensively and patient and family have decided on hospice Severe aortic stenosis calcification with mild to moderate stenosis valve area 1.5 Exudative fluid likely secondary to heart failure in addition to component of malignancy Patient had a tap of this this hospital stay 1.9 L but is stable for discharge on hospice Moderate COPD still smoker Nicotine patch Cirrhosis --etiology unclear HCVRNA was negative so I am presuming this was a hep B No follow-up with hep B titers   Procedures: Interventional radiology Palliative care  Discharge Exam: Vitals:   10/07/20 2055 10/08/20 0759  BP:  110/65  Pulse:  72  Resp:  20  Temp:  98.4 F (36.9 C)  SpO2: 95% 94%    Subj on day of d/c   Coherent awake alert happy He understands going home with hospice  General Exam on discharge  EOMI NCAT no focal deficit tolerating diet No chest pain no fever no chills no nausea no vomiting Abdomen soft nontender no rebound no guarding ROM intact No focal deficit   Discharge Instructions   Discharge Instructions     Diet - low sodium heart healthy   Complete by: As directed    Increase activity slowly   Complete by: As directed       Allergies as of 10/08/2020   No Known Allergies      Medication List     STOP taking these medications    levofloxacin 500 MG tablet Commonly known as: LEVAQUIN   predniSONE 10 MG tablet Commonly known as: DELTASONE       TAKE these medications    albuterol (2.5 MG/3ML) 0.083% nebulizer solution Commonly known as: PROVENTIL Take 3 mLs (2.5 mg total) by nebulization every 4 (four) hours as needed for wheezing or shortness  of breath.   fentaNYL 50 MCG/HR Commonly known as: [place] 1 patch onto the skin every 3 (three) days. What changed: Another medication with the same name was added. Make sure you understand how and when to take each.   fentaNYL 50  MCG/HR Commonly known as: [place] 1 patch onto the skin every 3 (three) days. Start taking on: October 09, 2020 What changed: You were already taking a medication with the same name, and this prescription was added. Make sure you understand how and when to take each.   haloperidol 2 MG/ML solution Commonly known as: HALDOL Place 0.3 mLs (0.6 mg total) under the tongue every 4 (four) hours as needed for agitation (or delirium).   ondansetron 4 MG disintegrating tablet Commonly known as: ZOFRAN-ODT Take 1 tablet (4 mg total) by mouth every 6 (six) hours as needed for nausea.       No Known Allergies  Follow-up [place] Follow up.   Why: referral for Hospice in the home Contact information: [address] [place] [address] [phone]                  The results of significant diagnostics from this hospitalization (including imaging, microbiology, ancillary and laboratory) are listed below for reference.    Significant Diagnostic Studies: DG Chest 1 View  Result Date: 10/05/2020 CLINICAL DATA:  Shortness of breath EXAM: CHEST  1 VIEW COMPARISON:  09/18/2020 FINDINGS: Complete opacification of the right hemithorax compatible with a combination of pleural effusion and complete right lung atelectasis. Diffuse interstitial opacities throughout the left lung. A skin fold is seen partially superimposed over the left lung apex. No evidence of pneumothorax. No pathologic fractures are seen. IMPRESSION: Complete opacification of the right hemithorax compatible with a combination of pleural effusion and complete right lung atelectasis. Findings slightly progressed from recent PET-CT. Electronically Signed   By: Davina Poke D.O.   On: 10/05/2020 12:33   NM PET Image Initial (PI) Skull Base To Thigh  Result Date: 09/20/2020 CLINICAL DATA:  Initial treatment strategy for lung nodule. 65 year old MALE EXAM: NUCLEAR MEDICINE PET SKULL BASE TO  THIGH TECHNIQUE: 8.6 mCi F-18 FDG was injected intravenously. Full-ring PET imaging was performed from the skull base to thigh after the radiotracer. CT data was obtained and used for attenuation correction and anatomic localization. Fasting blood glucose: 91 mg/dl COMPARISON:  Chest CT 08/14/2020. FINDINGS: Mediastinal blood pool activity: SUV max 1.4 Liver activity: SUV max NA NECK: Cluster of intensely hypermetabolic RIGHT supraclavicular nodes measuring 3.2 cm in mass with SUV max equal 9.1 (image 60). Incidental CT findings: none CHEST: Rim of intense hypermetabolic activity within the collapsed RIGHT middle lobe with SUV max equal 6.9. Hypermetabolic mass in the RIGHT suprahilar location with SUV max equal 10.1. There is partial collapse of the RIGHT upper lobe. Large RIGHT pleural effusion. Hypermetabolic nodularity in the medial pleural space of the RIGHT lower lobe SUV max equal 8.7 on image 128. Intense hypermetabolic RIGHT paratracheal adenopathy, subcarinal adenopathy and LEFT peribronchial adenopathy. Example LEFT peribronchial thickening SUV max equal 10.9. RIGHT tracheal nodes with SUV max equal 11.5 in measuring 2.5 cm in thickness. Hypermetabolic nodule in the LEFT upper lobe measuring 12 mm with SUV max equal 5.8. Incidental CT findings: none ABDOMEN/PELVIS: Extensive intense hypermetabolic upper abdominal adenopathy. Large node ventral to the pancreas in the gastrohepatic ligament measures 2.1 cm in thickness (image 171) with SUV max equal 14.4. Hypermetabolic retroperitoneal  para aortic nodes at the level the renal veins with SUV max equal 12.6 on image 190. Hypermetabolic adenopathy extends to a RIGHT external iliac lymph node with SUV max equal 11. No focal activity in the liver to suggest metastasis. Liver has a nodular contour. No adrenal metastasis Incidental CT findings: none SKELETON: Widespread intensely hypermetabolic skeletal metastasis. Example sclerotic lesion the posterior RIGHT  sacrum with SUV max equal 9.2. Intensely hypermetabolic lesion the posterior LEFT iliac bone with SUV max equal 10.9 and measuring 2 cm. These lesions are subtly sclerotic on the CT portion exam. Broad lesion involving the L4 vertebral body involves the near entire vertebral body SUV max equal 83.1 Hypermetabolic rib lesions additionally. Incidental CT findings: none IMPRESSION: 1. Difficult to determine primary lesion within the RIGHT lung with RIGHT middle lobe hypermetabolic mass and hypermetabolic RIGHT suprahilar mass. Extensive collapse of the RIGHT lower lobe, RIGHT middle lobe and partial collapse of the RIGHT upper lobe. Evidence of pleural metastasis in the RIGHT pleural space. 2. Extensive of hypermetabolic mediastinal and RIGHT supraclavicular nodal metastasis. 3. Metastatic nodule to the LEFT lung. 4. Extensive hypermetabolic metastatic adenopathy in the upper abdomen. 5. Widespread intensely skeletal metastasis involving the pelvis, spine and ribs. 6. Cirrhotic liver. Electronically Signed   By: Suzy Bouchard M.D.   On: 09/20/2020 10:33   DG CHEST PORT 1 VIEW  Result Date: 10/06/2020 CLINICAL DATA:  Pleural effusion. EXAM: PORTABLE CHEST 1 VIEW COMPARISON:  October 05, 2020. FINDINGS: Stable complete opacification of right hemithorax consistent with combination of effusion and atelectasis. Minimal left basilar subsegmental atelectasis is noted. No pneumothorax is noted. Bony thorax is unremarkable. IMPRESSION: Stable complete opacification of right hemithorax is noted consistent with combination of pleural effusion and atelectasis. Electronically Signed   By: Marijo Conception M.D.   On: 10/06/2020 13:04   DG Chest Port 1 View  Result Date: 10/05/2020 CLINICAL DATA:  Status post right thoracentesis with removal of 1.9 L of fluid. Prior imaging has demonstrated evidence of extensive hypermetabolic tumor in the right chest with metastatic lymphadenopathy. EXAM: PORTABLE CHEST 1 VIEW COMPARISON:  PET  scan on 09/18/2020 FINDINGS: After thoracentesis there is a minimal amount of aeration of the right upper lung with residual dense consolidation of the right lung and probably some residual pleural fluid remaining. Findings are consistent with obstructive tumor centrally which is not allowing much aeration to occur of the underlying lung. No pneumothorax. Expansion and aeration of the left lung is improved compared to the chest x-ray earlier today. IMPRESSION: Minimal aeration of the right upper lung after large volume right-sided thoracentesis. Findings are consistent with known obstructive tumor centrally which is not allowing much aeration to occur of the right lung. Left lung expansion has improved. No pneumothorax. Electronically Signed   By: Aletta Edouard M.D.   On: 10/05/2020 17:15   ECHOCARDIOGRAM COMPLETE  Result Date: 10/06/2020    ECHOCARDIOGRAM REPORT   Patient Name:   Wesley Hunt Date of Exam: 10/06/2020 Medical Rec #:  [id number]      Height:       67.0 in Accession #:    [id number]     Weight:       145.9 lb Date of Birth:  [birth year]      BSA:          1.769 m Patient Age:    65 years       BP:           163/91 mmHg  Patient Gender: M              HR:           88 bpm. Exam Location:  Inpatient Procedure: 2D Echo, 3D Echo, Cardiac Doppler and Color Doppler Indications:    I50.40* Unspecified combined systolic (congestive) and diastolic                 (congestive) heart failure  History:        Patient has no prior history of Echocardiogram examinations.                 Abnormal ECG, COPD, Arrythmias:Atrial Fibrillation,                 Signs/Symptoms:Shortness of Breath and Dyspnea; Risk                 Factors:Current Smoker. Cancer. Drug use. Pleural effusion. Post                 thoracentesis.  Sonographer:    Roseanna Rainbow RDCS Referring Phys: [phone] [facility]  Sonographer Comments: Patient in pain and moving throughout test. IMPRESSIONS  1. Left ventricular ejection fraction, by  estimation, is 65 to 70%. Left ventricular ejection fraction by 3D volume is 62 %. The left ventricle has normal function. The left ventricle has no regional wall motion abnormalities. There is moderate concentric left ventricular hypertrophy. Left ventricular diastolic parameters are consistent with Grade I diastolic dysfunction (impaired relaxation). Elevated left ventricular end-diastolic pressure.  2. Right ventricular systolic function was not well visualized. The right ventricular size is normal. Tricuspid regurgitation signal is inadequate for assessing PA pressure.  3. The mitral valve is normal in structure. Mild mitral valve regurgitation. No evidence of mitral stenosis.  4. The aortic valve is calcified. There is severe calcifcation of the aortic valve. There is severe thickening of the aortic valve. Aortic valve regurgitation is not visualized. Mild to moderate aortic valve stenosis. Aortic valve area, by VTI measures 1.52 cm. Aortic valve mean gradient measures 19.0 mmHg. Aortic valve Vmax measures 2.87 m/s.  5. Aortic dilatation noted. There is borderline dilatation of the ascending aorta, measuring 37 mm.  6. The inferior vena cava is dilated in size with >50% respiratory variability, suggesting right atrial pressure of 8 mmHg. FINDINGS  Left Ventricle: Left ventricular ejection fraction, by estimation, is 65 to 70%. Left ventricular ejection fraction by 3D volume is 62 %. The left ventricle has normal function. The left ventricle has no regional wall motion abnormalities. The left ventricular internal cavity size was normal in size. There is moderate concentric left ventricular hypertrophy. Left ventricular diastolic parameters are consistent with Grade I diastolic dysfunction (impaired relaxation). Elevated left ventricular end-diastolic pressure. Right Ventricle: The right ventricular size is normal. No increase in right ventricular wall thickness. Right ventricular systolic function was not well  visualized. Tricuspid regurgitation signal is inadequate for assessing PA pressure. Left Atrium: Left atrial size was normal in size. Right Atrium: Right atrial size was normal in size. Pericardium: There is no evidence of pericardial effusion. Mitral Valve: The mitral valve is normal in structure. Mild mitral annular calcification. Mild mitral valve regurgitation. No evidence of mitral valve stenosis. Tricuspid Valve: The tricuspid valve is normal in structure. Tricuspid valve regurgitation is trivial. No evidence of tricuspid stenosis. Aortic Valve: The aortic valve is calcified. There is severe calcifcation of the aortic valve. There is severe thickening of the aortic valve. Aortic valve regurgitation is not  visualized. Mild to moderate aortic stenosis is present. Aortic valve mean gradient measures 19.0 mmHg. Aortic valve peak gradient measures 32.9 mmHg. Aortic valve area, by VTI measures 1.52 cm. Pulmonic Valve: The pulmonic valve was normal in structure. Pulmonic valve regurgitation is not visualized. No evidence of pulmonic stenosis. Aorta: Aortic dilatation noted. There is borderline dilatation of the ascending aorta, measuring 37 mm. Venous: The inferior vena cava is dilated in size with greater than 50% respiratory variability, suggesting right atrial pressure of 8 mmHg. IAS/Shunts: The interatrial septum appears to be lipomatous. No atrial level shunt detected by color flow Doppler.  LEFT VENTRICLE PLAX 2D LVIDd:         3.60 cm         Diastology LVIDs:         2.20 cm         LV e' medial:    5.33 cm/s LV PW:         1.40 cm         LV E/e' medial:  19.7 LV IVS:        1.50 cm         LV e' lateral:   6.31 cm/s LVOT diam:     2.00 cm         LV E/e' lateral: 16.6 LV SV:         97 LV SV Index:   55 LVOT Area:     3.14 cm        3D Volume EF                                LV 3D EF:    Left                                             ventricular LV Volumes (MOD)                            ejection LV  vol d, MOD    72.9 ml                    fraction by A2C:                                        3D volume LV vol d, MOD    66.7 ml                    is 62 %. A4C: LV vol s, MOD    20.0 ml A2C:                           3D Volume EF: LV vol s, MOD    23.3 ml       3D EF:        62 % A4C: LV SV MOD A2C:   52.9 ml LV SV MOD A4C:   66.7 ml LV SV MOD BP:    50.1 ml RIGHT VENTRICLE             IVC RV S prime:     15.90 cm/s  IVC diam: 2.10  cm TAPSE (M-mode): 2.7 cm LEFT ATRIUM             Index       RIGHT ATRIUM          Index LA diam:        3.20 cm 1.81 cm/m  RA Area:     6.45 cm LA Vol (A2C):   27.5 ml 15.55 ml/m RA Volume:   8.04 ml  4.55 ml/m LA Vol (A4C):   27.6 ml 15.60 ml/m LA Biplane Vol: 27.5 ml 15.55 ml/m  AORTIC VALVE AV Area (Vmax):    1.80 cm AV Area (Vmean):   1.61 cm AV Area (VTI):     1.52 cm AV Vmax:           287.00 cm/s AV Vmean:          209.000 cm/s AV VTI:            0.641 m AV Peak Grad:      32.9 mmHg AV Mean Grad:      19.0 mmHg LVOT Vmax:         164.00 cm/s LVOT Vmean:        107.250 cm/s LVOT VTI:          0.310 m LVOT/AV VTI ratio: 0.48  AORTA Ao Root diam: 3.00 cm Ao Asc diam:  3.70 cm MITRAL VALVE MV Area (PHT): 4.68 cm     SHUNTS MV Decel Time: 162 msec     Systemic VTI:  0.31 m MV E velocity: 105.00 cm/s  Systemic Diam: 2.00 cm MV A velocity: 145.00 cm/s MV E/A ratio:  0.72 Fransico Him MD Electronically signed by Fransico Him MD Signature Date/Time: 10/06/2020/12:25:34 PM    Final    IR THORACENTESIS ASP PLEURAL SPACE W/IMG GUIDE  Result Date: 10/05/2020 INDICATION: Right pleural effusion, shortness of breast. Request for therapeutic and diagnostic thoracentesis. EXAM: ULTRASOUND GUIDED RIGHT THORACENTESIS MEDICATIONS: 10 mL 1% lidocaine COMPLICATIONS: None immediate. PROCEDURE: An ultrasound guided thoracentesis was thoroughly discussed with the patient and questions answered. The benefits, risks, alternatives and complications were also discussed. The patient understands  and wishes to proceed with the procedure. Written consent was obtained. Ultrasound was performed to localize and mark an adequate pocket of fluid in the right chest. The area was then prepped and draped in the normal sterile fashion. 1% Lidocaine was used for local anesthesia. Under ultrasound guidance a 6 Fr Safe-T-Centesis catheter was introduced. Thoracentesis was performed. The catheter was removed and a dressing applied. FINDINGS: A total of approximately 1.9 L of clear amber fluid was removed. Samples were sent to the laboratory as requested by the clinical team. Postprocedural chest x-ray ordered, pending. IMPRESSION: Successful ultrasound guided right thoracentesis yielding 1.9 L of pleural fluid. Read by: Durenda Guthrie, PA-C Electronically Signed   By: Aletta Edouard M.D.   On: 10/05/2020 16:58    Microbiology: Recent Results (from the past 240 hour(s))  Resp Panel by RT-PCR (Flu A&B, Covid) Nasopharyngeal Swab     Status: None   Collection Time: 10/05/20  1:07 PM   Specimen: Nasopharyngeal Swab; Nasopharyngeal(NP) swabs in vial transport medium  Result Value Ref Range Status   SARS Coronavirus 2 by RT PCR NEGATIVE NEGATIVE Final    Comment: (NOTE) SARS-CoV-2 target nucleic acids are NOT DETECTED.  The SARS-CoV-2 RNA is generally detectable in upper respiratory specimens during the acute phase of infection. The lowest concentration of SARS-CoV-2 viral copies this assay can detect is 138 copies/mL.  A negative result does not preclude SARS-Cov-2 infection and should not be used as the sole basis for treatment or other patient management decisions. A negative result may occur with  improper specimen collection/handling, submission of specimen other than nasopharyngeal swab, presence of viral mutation(s) within the areas targeted by this assay, and inadequate number of viral copies(<138 copies/mL). A negative result must be combined with clinical observations, patient history, and  epidemiological information. The expected result is Negative.  Fact Sheet for Patients:  EntrepreneurPulse.com.au  Fact Sheet for Healthcare Providers:  IncredibleEmployment.be  This test is no t yet approved or cleared by the [place] FDA and  has been authorized for detection and/or diagnosis of SARS-CoV-2 by FDA under an Emergency Use Authorization (EUA). This EUA will remain  in effect (meaning this test can be used) for the duration of the COVID-19 declaration under Section 564(b)(1) of the Act, 21 U.S.C.section 360bbb-3(b)(1), unless the authorization is terminated  or revoked sooner.       Influenza A by PCR NEGATIVE NEGATIVE Final   Influenza B by PCR NEGATIVE NEGATIVE Final    Comment: (NOTE) The Xpert Xpress SARS-CoV-2/FLU/RSV plus assay is intended as an aid in the diagnosis of influenza from Nasopharyngeal swab specimens and should not be used as a sole basis for treatment. Nasal washings and aspirates are unacceptable for Xpert Xpress SARS-CoV-2/FLU/RSV testing.  Fact Sheet for Patients: EntrepreneurPulse.com.au  Fact Sheet for Healthcare Providers: IncredibleEmployment.be  This test is not yet approved or cleared by the [place] FDA and has been authorized for detection and/or diagnosis of SARS-CoV-2 by FDA under an Emergency Use Authorization (EUA). This EUA will remain in effect (meaning this test can be used) for the duration of the COVID-19 declaration under Section 564(b)(1) of the Act, 21 U.S.C. section 360bbb-3(b)(1), unless the authorization is terminated or revoked.  Performed at [facility], [place] [address], [place], [place] [address]   Culture, blood (routine x 2)     Status: None (Preliminary result)   Collection Time: 10/05/20  1:22 PM   Specimen: BLOOD RIGHT WRIST  Result Value Ref Range Status   Specimen Description BLOOD RIGHT WRIST  Final   Special  Requests   Final    BOTTLES DRAWN AEROBIC AND ANAEROBIC Blood Culture adequate volume   Culture   Final    NO GROWTH 3 DAYS Performed at [facility], [place] [address] [place], [place] [address]    Report Status PENDING  Incomplete  Culture, blood (routine x 2)     Status: None (Preliminary result)   Collection Time: 10/05/20  1:22 PM   Specimen: BLOOD  Result Value Ref Range Status   Specimen Description BLOOD LEFT ANTECUBITAL  Final   Special Requests   Final    BOTTLES DRAWN AEROBIC AND ANAEROBIC Blood Culture adequate volume   Culture   Final    NO GROWTH 3 DAYS Performed at [facility], [place] [address] [place], [place] [address]    Report Status PENDING  Incomplete  Gram stain     Status: None   Collection Time: 10/05/20  5:03 PM   Specimen: Lung, Right; Pleural Fluid  Result Value Ref Range Status   Specimen Description PLEURAL RIGHT LUNG  Final   Special Requests NONE  Final   Gram Stain   Final    WBC PRESENT, PREDOMINANTLY MONONUCLEAR NO ORGANISMS SEEN CYTOSPIN SMEAR Performed at [facility], [place] [address] [place], [place] [address]  Report Status 10/05/2020 FINAL  Final  Acid Fast Smear (AFB)     Status: None   Collection Time: 10/05/20  5:03 PM   Specimen: Lung, Right; Pleural Fluid  Result Value Ref Range Status   AFB Specimen Processing Concentration  Final   Acid Fast Smear Negative  Final    Comment: (NOTE) Performed At: [facility] [place], [place] [id number] Rush Farmer MD WU:[id number]    Source (AFB) FLUID  Final    Comment: PLEURAL RIGHT Performed at [facility], [place] [address] [place], [place] [address]   Culture, body fluid w Gram Stain-bottle     Status: None (Preliminary result)   Collection Time: 10/05/20  5:03 PM   Specimen: Fluid  Result Value Ref Range Status   Specimen Description FLUID RIGHT LUNG  Final   Special Requests BOTTLES DRAWN AEROBIC AND ANAEROBIC  Final   Culture    Final    NO GROWTH 3 DAYS Performed at [facility], [place] [address] [place], [place] [address]    Report Status PENDING  Incomplete     Labs: Basic Metabolic Panel: Recent Labs  Lab 10/03/20 1348 10/05/20 1306 10/06/20 0301  NA 135 133* 135  K 4.1 4.0 3.6  CL 97* 96* 96*  CO2 27 27 30   GLUCOSE 108* 100* 89  BUN 19 16 19   CREATININE 0.63 0.49* 0.54*  CALCIUM 9.8 9.2 9.0   Liver Function Tests: Recent Labs  Lab 10/03/20 1348 10/05/20 1306 10/06/20 0301  AST 44* 38 37  ALT 23 25 21   ALKPHOS 192* 150* 127*  BILITOT 0.6 0.7 0.7  PROT 7.5 6.8 6.2*  ALBUMIN 2.7* 2.5* 2.9*   No results for input(s): LIPASE, AMYLASE in the last 168 hours. No results for input(s): AMMONIA in the last 168 hours. CBC: Recent Labs  Lab 10/03/20 1348 10/05/20 1306 10/06/20 0301  WBC 14.7* 16.6* 16.2*  NEUTROABS 13.2* 15.1*  --   HGB 13.5 13.4 12.3*  HCT 39.8 40.5 37.3*  MCV 90.0 92.3 93.3  PLT 404* 397 331   Cardiac Enzymes: No results for input(s): CKTOTAL, CKMB, CKMBINDEX, TROPONINI in the last 168 hours. BNP: BNP (last 3 results) Recent Labs    10/05/20 1306  BNP 151.4*    ProBNP (last 3 results) No results for input(s): PROBNP in the last 8760 hours.  CBG: No results for input(s): GLUCAP in the last 168 hours.     Signed:  Nita Sells MD   [facility] 10/08/2020, 5:50 PM

## 2020-10-08 NOTE — TOC Progression Note (Signed)
Transition of Care (TOC) - Progression Note  Marvetta Gibbons RN, BSN Transitions of Care Unit 4E- RN Case Manager See Treatment Team for direct phone #     Patient Details  Name: Wesley Hunt MRN: [id number] Date of Birth: 1956-03-29  [facility] CM/SW Contact  Dahlia Client, Romeo Rabon, RN Phone Number: 10/08/2020, 9:56 AM  Clinical Narrative:    Return call received from [facility] with [facility], confirmed that they have received referral for home hospice on 6/11. They will start review and process for Hospice approval and order DME for delivery tomorrow.  Need to confirm if pt is going home with sister or brother, will reach out to sister [name] today.  TOC to continue to follow for transition home with hospice.     Expected Discharge Plan: [place]    Expected Discharge Plan and Services Expected Discharge Plan: [place] Determinants of Health (SDOH) Interventions    Readmission Risk Interventions No flowsheet data found.

## 2020-10-08 NOTE — Progress Notes (Signed)
   Palliative Medicine Inpatient Follow Up Note  Reason for consult:  "symptom management and goals of care--please see tomorrow if possible to consolidate discussions already started-"   HPI:  Per intake H&P --> 65 year old male with a PMHx: metastatic right lung cancer complicated by large right-sided pleural effusion-final diagnosis still not clarified as new diagnosis followed by Dr. Earlie Server, moderate COPD continued smoker, hepatitis C complicated by cirrhosis, new colon cancer followed by GI, presumed aortic stenosis, tuberculosis in the 1970s. Evaluated by Oncology this afternoon - vocalized his cancer is beyond the point of cure. Patient shared that he is suffering - Oncology made him a DNR and switched focus to comfort oriented care.   Palliative care is involved to aid in symptom management.   Today's Discussion (10/08/2020):  *Please note that this is a verbal dictation therefore any spelling or grammatical errors are due to the "[place] [employer]" system interpretation.  Chart reviewed. Wesley Hunt received x2 additional doses of PRN fentanyl in the past 24 hours. He appears comfortable this morning and in no distress. He shares with me that he is hungry and preparing to order is breakfast.   I spoke to patients sister, Wesley Hunt over the phone. She shares that she has some things in her home to move around prior to Wesley Hunt's DME being delivered. She was debating if she needs a hospital bed and I recommended that she get one as I anticipate the care needs will be greater for Wesley Hunt in time.   We reviewed the long road Wesley Hunt has been on and how it took him a long time to "get here" mentally as he had struggled for sometime accepting his diagnosis and prognosis.   Questions and concerns addressed   Objective Assessment: Vital Signs Vitals:   10/07/20 2055 10/08/20 0759  BP:  110/65  Pulse:  72  Resp:  20  Temp:  98.4 F (36.9 C)  SpO2: 95% 94%    Intake/Output Summary (Last 24 hours) at  10/08/2020 0857 Last data filed at 10/07/2020 2025 Gross per 24 hour  Intake 238 ml  Output 630 ml  Net -392 ml   Last Weight  Most recent update: 10/05/2020  1:05 PM    Weight  66.2 kg (145 lb 15.1 oz)            Gen:  Older caucasian M in slight distress HEENT: moist mucous membranes CV: Regular rate and rhythm PULM: On RA  ABD: soft/nontender EXT: No edema Neuro: Alert and oriented x3  SSUMMARY OF RECOMMENDATIONS   DNAR/DNI   Comfort Care   Unrestricted Visitation   Medications per [facility]   TOC - [facility] consult for home   Ongoing PMT support  Time Spent: 25 Greater than 50% of the time was spent in counseling and coordination of care ______________________________________________________________________________________ [place] Team Team Cell Phone: [phone] Please utilize secure chat with additional questions, if there is no response within 30 minutes please call the above phone number  Palliative Medicine Team providers are available by phone from 7am to 7pm daily and can be reached through the team cell phone.  Should this patient require assistance outside of these hours, please call the patient's attending physician.

## 2020-10-09 ENCOUNTER — Other Ambulatory Visit (HOSPITAL_COMMUNITY): Payer: Medicare Other

## 2020-10-09 ENCOUNTER — Ambulatory Visit: Payer: Medicare Other | Admitting: Radiation Oncology

## 2020-10-09 DIAGNOSIS — Z7189 Other specified counseling: Secondary | ICD-10-CM | POA: Diagnosis not present

## 2020-10-09 DIAGNOSIS — Z515 Encounter for palliative care: Secondary | ICD-10-CM | POA: Diagnosis not present

## 2020-10-09 DIAGNOSIS — C3411 Malignant neoplasm of upper lobe, right bronchus or lung: Principal | ICD-10-CM

## 2020-10-09 MED ORDER — FENTANYL 75 MCG/HR TD PT72
1.0000 | MEDICATED_PATCH | TRANSDERMAL | Status: DC
  Administered 2020-10-09: 1 via TRANSDERMAL
  Filled 2020-10-09: qty 1

## 2020-10-09 MED ORDER — CALCIUM CARBONATE ANTACID 500 MG PO CHEW: 1.0000 | CHEWABLE_TABLET | Freq: Three times a day (TID) | ORAL | Status: DC

## 2020-10-09 MED ORDER — BISACODYL 10 MG RE SUPP: 10.0000 mg | Freq: Every day | RECTAL | Status: DC | PRN

## 2020-10-09 MED ORDER — FENTANYL 25 MCG/HR TD PT72: 1.0000 | MEDICATED_PATCH | TRANSDERMAL | Status: DC

## 2020-10-09 MED ORDER — OXYCODONE HCL 5 MG PO TABS
10.0000 mg | ORAL_TABLET | ORAL | Status: DC | PRN
  Administered 2020-10-09 (×4): 10 mg via ORAL
  Filled 2020-10-09 (×4): qty 2

## 2020-10-09 MED ORDER — FENTANYL CITRATE (PF) 100 MCG/2ML IJ SOLN
50.0000 ug | INTRAMUSCULAR | Status: DC | PRN
  Administered 2020-10-09 (×4): 50 ug via INTRAVENOUS
  Filled 2020-10-09 (×4): qty 2

## 2020-10-09 MED ORDER — CALCIUM CARBONATE ANTACID 500 MG PO CHEW
1.0000 | CHEWABLE_TABLET | Freq: Three times a day (TID) | ORAL | Status: DC
  Administered 2020-10-09 (×2): 200 mg via ORAL
  Filled 2020-10-09 (×3): qty 1

## 2020-10-09 MED ORDER — FENTANYL 75 MCG/HR TD PT72: 1.0000 | MEDICATED_PATCH | TRANSDERMAL | 0 refills | Status: DC

## 2020-10-09 NOTE — Care Management Important Message (Signed)
Important Message  Patient Details  Name: MARKES SHATSWELL MRN: [id number] Date of Birth: 1955/10/31   Medicare Important Message Given:  Yes     Shelda Altes 10/09/2020, 10:30 AM

## 2020-10-09 NOTE — Progress Notes (Signed)
Palliative Medicine Inpatient Follow Up Note  Reason for consult:  "symptom management and goals of care--please see tomorrow if possible to consolidate discussions already started-"   HPI:  Per intake H&P --> 65 year old male with a PMHx: metastatic right lung cancer complicated by large right-sided pleural effusion-final diagnosis still not clarified as new diagnosis followed by Wesley Hunt, moderate COPD continued smoker, hepatitis C complicated by cirrhosis, new colon cancer followed by GI, presumed aortic stenosis, tuberculosis in the 1970s. Evaluated by Oncology this afternoon - vocalized his cancer is beyond the point of cure. Patient shared that he is suffering - Oncology made him a DNR and switched focus to comfort oriented care.   Palliative care is involved to aid in symptom management.   Today's Discussion (10/09/2020):  *Please note that this is a verbal dictation therefore any spelling or grammatical errors are due to the "[place] [employer]" system interpretation.  Chart reviewed. Wesley Hunt received x2 additional doses of PRN fentanyl in the past 24 hours as well as x2 doses of percocet.   When I met with Wesley Hunt this morning he shares with me that he "had an awful night". I asked him to help me better understand that statement. He shares that in the last 24 hours he has had an increase in pain. We reviewed the medications he is presently on to address his pain and how we will increase his fentanyl patch and PRN fentanyl. Patient also concerned about the amount of tylenol he is receiving. I shared that we can provide oxycodone alone as opposed to percocet which he was in agreement with.  Patient complains of mild abdominal discomfort and reflux. We further discussed starting Tums to aid in relieving this.  I spoke to patients sister, Wesley Hunt this morning. We reviewed the plan for Wesley Hunt to transition home with hospice today. She expressed quite a bit of anxiety which I was able to alleviate  through therapeutic listening.   Questions and concerns addressed   Objective Assessment: Vital Signs Vitals:   10/08/20 1928 10/09/20 0432  BP: 116/63   Pulse: 87 94  Resp: 18 20  Temp: 98.5 F (36.9 C) 98.1 F (36.7 C)  SpO2: 96% 96%    Intake/Output Summary (Last 24 hours) at 10/09/2020 0741 Last data filed at 10/09/2020 2992 Gross per 24 hour  Intake 250 ml  Output 1020 ml  Net -770 ml    Last Weight  Most recent update: 10/05/2020  1:05 PM    Weight  66.2 kg (145 lb 15.1 oz)            Gen:  Older caucasian M in slight distress HEENT: moist mucous membranes CV: Regular rate and rhythm PULM: On RA  ABD: soft/nontender EXT: No edema Neuro: Alert and oriented x3  SSUMMARY OF RECOMMENDATIONS   DNAR/DNI   Comfort Care   Unrestricted Visitation   Medications per [facility]   TOC - [facility] consult for home   Generalized Pain: - Increase Fentanyl Patch to 36mg - Increase PRN Fentanyl to 567m IV for breakthrough pain - DC Percocet - Start Oxycodone 1083mO Q3H PRN for moderate/severe pain  Acid Reflux: - TUMS TID w/ meals  Constipation: - Bisacodyl 76m57mN   Time Spent: 35 Greater than 50% of the time was spent in counseling and coordination of care ______________________________________________________________________________________ [place] Team Cell Phone: [phone] utilize secure chat with additional questions, if there is no response within 30 minutes  please call the above phone number  Palliative Medicine Team providers are available by phone from 7am to 7pm daily and can be reached through the team cell phone.  Should this patient require assistance outside of these hours, please call the patient's attending physician.

## 2020-10-09 NOTE — Discharge Summary (Addendum)
Physician Discharge Summary  Wesley Hunt EPP:[id number] DOB: Aug 10, 1955 DOA: 10/05/2020  PCP: Wesley Chroman, MD  Admit date: 10/05/2020 Discharge date: 10/09/2020  Patient has stabilized for discharge only new medication overnight as Tums and patient can go home with home hospice when all equipment is delivered  Time spent: 37 minutes  Recommendations for Outpatient Follow-up:  Patient discharging home with home hospice  Discharge Diagnoses:  MAIN problem for hospitalization   Hypoxic respiratory failure secondary to large intractable uncurable pleural effusion with metastatic lung cancer  Please see below for itemized issues addressed in [place]- refer to other progress notes for clarity if needed  Discharge Condition: Guarded  Diet recommendation: Regular  Filed Weights   10/05/20 1305  Weight: 66.2 kg    History of present illness:  65 year old male Metastatic right lung cancer complicated by large right-sided pleural effusion-final diagnosis still not clarified as new diagnosis followed by Dr. Earlie Hunt Moderate COPD continued smoker Hepatitis C complicated by cirrhosis ?  New colon cancer followed by GI Presumed aortic stenosis Tuberculosis in the 1970s   Patient admitted from interventional radiology suite 10/05/2020 SOB with background progressive dyspnea over the past 6 weeks  Found to have white out lung right side IR consulted-thoracentesis performed Oncology consulted, XRT consulted should be transferred to [facility] 10/08/2020 for consideration of XRT commencement under Dr. Myer Hunt Course:  Acute hypoxic respiratory failure 2/2 large right-sided pleural effusion + metastatic cancer---presumed lung primary Episodic desaturation whenever patient lays flat Patient was on Zosyn but this was de-escalated as patient is now full comfort Paroxysmal A. fib recurring 10/07/2020 CHADS2 score >3 Flipped into paroxysmal A. fib but now is full comfort  discontinued all rate control at this discharge Metastatic lung disease--extensive as per last note by Dr. Earlie Hunt 6/7 Therapeutic versus palliative trajectory discussed with patient and sister at bedside Wesley Hunt Extensively and patient and family have decided on hospice Severe aortic stenosis calcification with mild to moderate stenosis valve area 1.5 Exudative fluid likely secondary to heart failure in addition to component of malignancy Patient had a tap of this this hospital stay 1.9 L but is stable for discharge on hospice Moderate COPD still smoker Nicotine patch Cirrhosis --etiology unclear HCVRNA was negative so I am presuming this was a hep B No follow-up with hep B titers   Procedures: Interventional radiology Palliative care  Discharge Exam: Vitals:   10/09/20 0432 10/09/20 0855  BP:  116/78  Pulse: 94 (!) 52  Resp: 20 20  Temp: 98.1 F (36.7 C) 98.3 F (36.8 C)  SpO2: 96% 92%    Subj on day of d/c   Coherent awake he has some discomfort in his stomach He states that Tums helped and he wants a prescription Otherwise he is stable  General Exam on discharge  Coherent no distress EOMI NCAT S1 S2 no m/r/g Cta b no added sound no rales today but deacred AE unilaterally Power 5/5    Discharge Instructions   Discharge Instructions     Diet - low sodium heart healthy   Complete by: As directed    Increase activity slowly   Complete by: As directed       Allergies as of 10/09/2020   No Known Allergies      Medication List     STOP taking these medications    levofloxacin 500 MG tablet Commonly known as: LEVAQUIN   predniSONE 10 MG tablet Commonly known as: DELTASONE  TAKE these medications    albuterol (2.5 MG/3ML) 0.083% nebulizer solution Commonly known as: PROVENTIL Take 3 mLs (2.5 mg total) by nebulization every 4 (four) hours as needed for wheezing or shortness of breath.   calcium carbonate 500 MG chewable tablet Commonly  known as: TUMS - dosed in mg elemental calcium Chew 1 tablet (200 mg of elemental calcium total) by mouth 3 (three) times daily with meals.   fentaNYL 50 MCG/HR Commonly known as: [place] 1 patch onto the skin every 3 (three) days. What changed: Another medication with the same name was added. Make sure you understand how and when to take each.   fentaNYL 50 MCG/HR Commonly known as: [place] 1 patch onto the skin every 3 (three) days. What changed: You were already taking a medication with the same name, and this prescription was added. Make sure you understand how and when to take each.   haloperidol 2 MG/ML solution Commonly known as: HALDOL Place 0.3 mLs (0.6 mg total) under the tongue every 4 (four) hours as needed for agitation (or delirium).   ondansetron 4 MG disintegrating tablet Commonly known as: ZOFRAN-ODT Take 1 tablet (4 mg total) by mouth every 6 (six) hours as needed for nausea.      No Known Allergies  Follow-up [facility] Follow up.   Why: referral for Hospice in the home Contact information: [address] [place] [address] [phone]                  The results of significant diagnostics from this hospitalization (including imaging, microbiology, ancillary and laboratory) are listed below for reference.    Significant Diagnostic Studies: DG Chest 1 View  Result Date: 10/05/2020 CLINICAL DATA:  Shortness of breath EXAM: CHEST  1 VIEW COMPARISON:  09/18/2020 FINDINGS: Complete opacification of the right hemithorax compatible with a combination of pleural effusion and complete right lung atelectasis. Diffuse interstitial opacities throughout the left lung. A skin fold is seen partially superimposed over the left lung apex. No evidence of pneumothorax. No pathologic fractures are seen. IMPRESSION: Complete opacification of the right hemithorax compatible with a combination of pleural effusion and  complete right lung atelectasis. Findings slightly progressed from recent PET-CT. Electronically Signed   By: Davina Poke D.O.   On: 10/05/2020 12:33   NM PET Image Initial (PI) Skull Base To Thigh  Result Date: 09/20/2020 CLINICAL DATA:  Initial treatment strategy for lung nodule. 65 year old MALE EXAM: NUCLEAR MEDICINE PET SKULL BASE TO THIGH TECHNIQUE: 8.6 mCi F-18 FDG was injected intravenously. Full-ring PET imaging was performed from the skull base to thigh after the radiotracer. CT data was obtained and used for attenuation correction and anatomic localization. Fasting blood glucose: 91 mg/dl COMPARISON:  Chest CT 08/14/2020. FINDINGS: Mediastinal blood pool activity: SUV max 1.4 Liver activity: SUV max NA NECK: Cluster of intensely hypermetabolic RIGHT supraclavicular nodes measuring 3.2 cm in mass with SUV max equal 9.1 (image 60). Incidental CT findings: none CHEST: Rim of intense hypermetabolic activity within the collapsed RIGHT middle lobe with SUV max equal 6.9. Hypermetabolic mass in the RIGHT suprahilar location with SUV max equal 10.1. There is partial collapse of the RIGHT upper lobe. Large RIGHT pleural effusion. Hypermetabolic nodularity in the medial pleural space of the RIGHT lower lobe SUV max equal 8.7 on image 128. Intense hypermetabolic RIGHT paratracheal adenopathy, subcarinal adenopathy and LEFT peribronchial adenopathy. Example LEFT peribronchial thickening SUV max equal 10.9. RIGHT tracheal  nodes with SUV max equal 11.5 in measuring 2.5 cm in thickness. Hypermetabolic nodule in the LEFT upper lobe measuring 12 mm with SUV max equal 5.8. Incidental CT findings: none ABDOMEN/PELVIS: Extensive intense hypermetabolic upper abdominal adenopathy. Large node ventral to the pancreas in the gastrohepatic ligament measures 2.1 cm in thickness (image 171) with SUV max equal 14.4. Hypermetabolic retroperitoneal para aortic nodes at the level the renal veins with SUV max equal 12.6 on image  190. Hypermetabolic adenopathy extends to a RIGHT external iliac lymph node with SUV max equal 11. No focal activity in the liver to suggest metastasis. Liver has a nodular contour. No adrenal metastasis Incidental CT findings: none SKELETON: Widespread intensely hypermetabolic skeletal metastasis. Example sclerotic lesion the posterior RIGHT sacrum with SUV max equal 9.2. Intensely hypermetabolic lesion the posterior LEFT iliac bone with SUV max equal 10.9 and measuring 2 cm. These lesions are subtly sclerotic on the CT portion exam. Broad lesion involving the L4 vertebral body involves the near entire vertebral body SUV max equal 01.0 Hypermetabolic rib lesions additionally. Incidental CT findings: none IMPRESSION: 1. Difficult to determine primary lesion within the RIGHT lung with RIGHT middle lobe hypermetabolic mass and hypermetabolic RIGHT suprahilar mass. Extensive collapse of the RIGHT lower lobe, RIGHT middle lobe and partial collapse of the RIGHT upper lobe. Evidence of pleural metastasis in the RIGHT pleural space. 2. Extensive of hypermetabolic mediastinal and RIGHT supraclavicular nodal metastasis. 3. Metastatic nodule to the LEFT lung. 4. Extensive hypermetabolic metastatic adenopathy in the upper abdomen. 5. Widespread intensely skeletal metastasis involving the pelvis, spine and ribs. 6. Cirrhotic liver. Electronically Signed   By: Suzy Bouchard M.D.   On: 09/20/2020 10:33   DG CHEST PORT 1 VIEW  Result Date: 10/06/2020 CLINICAL DATA:  Pleural effusion. EXAM: PORTABLE CHEST 1 VIEW COMPARISON:  October 05, 2020. FINDINGS: Stable complete opacification of right hemithorax consistent with combination of effusion and atelectasis. Minimal left basilar subsegmental atelectasis is noted. No pneumothorax is noted. Bony thorax is unremarkable. IMPRESSION: Stable complete opacification of right hemithorax is noted consistent with combination of pleural effusion and atelectasis. Electronically Signed   By:  Marijo Conception M.D.   On: 10/06/2020 13:04   DG Chest Port 1 View  Result Date: 10/05/2020 CLINICAL DATA:  Status post right thoracentesis with removal of 1.9 L of fluid. Prior imaging has demonstrated evidence of extensive hypermetabolic tumor in the right chest with metastatic lymphadenopathy. EXAM: PORTABLE CHEST 1 VIEW COMPARISON:  PET scan on 09/18/2020 FINDINGS: After thoracentesis there is a minimal amount of aeration of the right upper lung with residual dense consolidation of the right lung and probably some residual pleural fluid remaining. Findings are consistent with obstructive tumor centrally which is not allowing much aeration to occur of the underlying lung. No pneumothorax. Expansion and aeration of the left lung is improved compared to the chest x-ray earlier today. IMPRESSION: Minimal aeration of the right upper lung after large volume right-sided thoracentesis. Findings are consistent with known obstructive tumor centrally which is not allowing much aeration to occur of the right lung. Left lung expansion has improved. No pneumothorax. Electronically Signed   By: Aletta Edouard M.D.   On: 10/05/2020 17:15   ECHOCARDIOGRAM COMPLETE  Result Date: 10/06/2020    ECHOCARDIOGRAM REPORT   Patient Name:   MAKAIO MACH Date of Exam: 10/06/2020 Medical Rec #:  [id number]      Height:       67.0 in Accession #:  [id number]     Weight:       145.9 lb Date of Birth:  1955-09-01      BSA:          1.769 m Patient Age:    11 years       BP:           163/91 mmHg Patient Gender: M              HR:           88 bpm. Exam Location:  Inpatient Procedure: 2D Echo, 3D Echo, Cardiac Doppler and Color Doppler Indications:    I50.40* Unspecified combined systolic (congestive) and diastolic                 (congestive) heart failure  History:        Patient has no prior history of Echocardiogram examinations.                 Abnormal ECG, COPD, Arrythmias:Atrial Fibrillation,                  Signs/Symptoms:Shortness of Breath and Dyspnea; Risk                 Factors:Current Smoker. Cancer. Drug use. Pleural effusion. Post                 thoracentesis.  Sonographer:    Roseanna Rainbow RDCS Referring Phys: [phone] [facility]  Sonographer Comments: Patient in pain and moving throughout test. IMPRESSIONS  1. Left ventricular ejection fraction, by estimation, is 65 to 70%. Left ventricular ejection fraction by 3D volume is 62 %. The left ventricle has normal function. The left ventricle has no regional wall motion abnormalities. There is moderate concentric left ventricular hypertrophy. Left ventricular diastolic parameters are consistent with Grade I diastolic dysfunction (impaired relaxation). Elevated left ventricular end-diastolic pressure.  2. Right ventricular systolic function was not well visualized. The right ventricular size is normal. Tricuspid regurgitation signal is inadequate for assessing PA pressure.  3. The mitral valve is normal in structure. Mild mitral valve regurgitation. No evidence of mitral stenosis.  4. The aortic valve is calcified. There is severe calcifcation of the aortic valve. There is severe thickening of the aortic valve. Aortic valve regurgitation is not visualized. Mild to moderate aortic valve stenosis. Aortic valve area, by VTI measures 1.52 cm. Aortic valve mean gradient measures 19.0 mmHg. Aortic valve Vmax measures 2.87 m/s.  5. Aortic dilatation noted. There is borderline dilatation of the ascending aorta, measuring 37 mm.  6. The inferior vena cava is dilated in size with >50% respiratory variability, suggesting right atrial pressure of 8 mmHg. FINDINGS  Left Ventricle: Left ventricular ejection fraction, by estimation, is 65 to 70%. Left ventricular ejection fraction by 3D volume is 62 %. The left ventricle has normal function. The left ventricle has no regional wall motion abnormalities. The left ventricular internal cavity size was normal in size. There is  moderate concentric left ventricular hypertrophy. Left ventricular diastolic parameters are consistent with Grade I diastolic dysfunction (impaired relaxation). Elevated left ventricular end-diastolic pressure. Right Ventricle: The right ventricular size is normal. No increase in right ventricular wall thickness. Right ventricular systolic function was not well visualized. Tricuspid regurgitation signal is inadequate for assessing PA pressure. Left Atrium: Left atrial size was normal in size. Right Atrium: Right atrial size was normal in size. Pericardium: There is no evidence of pericardial effusion. Mitral Valve: The mitral valve is normal in  structure. Mild mitral annular calcification. Mild mitral valve regurgitation. No evidence of mitral valve stenosis. Tricuspid Valve: The tricuspid valve is normal in structure. Tricuspid valve regurgitation is trivial. No evidence of tricuspid stenosis. Aortic Valve: The aortic valve is calcified. There is severe calcifcation of the aortic valve. There is severe thickening of the aortic valve. Aortic valve regurgitation is not visualized. Mild to moderate aortic stenosis is present. Aortic valve mean gradient measures 19.0 mmHg. Aortic valve peak gradient measures 32.9 mmHg. Aortic valve area, by VTI measures 1.52 cm. Pulmonic Valve: The pulmonic valve was normal in structure. Pulmonic valve regurgitation is not visualized. No evidence of pulmonic stenosis. Aorta: Aortic dilatation noted. There is borderline dilatation of the ascending aorta, measuring 37 mm. Venous: The inferior vena cava is dilated in size with greater than 50% respiratory variability, suggesting right atrial pressure of 8 mmHg. IAS/Shunts: The interatrial septum appears to be lipomatous. No atrial level shunt detected by color flow Doppler.  LEFT VENTRICLE PLAX 2D LVIDd:         3.60 cm         Diastology LVIDs:         2.20 cm         LV e' medial:    5.33 cm/s LV PW:         1.40 cm         LV E/e'  medial:  19.7 LV IVS:        1.50 cm         LV e' lateral:   6.31 cm/s LVOT diam:     2.00 cm         LV E/e' lateral: 16.6 LV SV:         97 LV SV Index:   55 LVOT Area:     3.14 cm        3D Volume EF                                LV 3D EF:    Left                                             ventricular LV Volumes (MOD)                            ejection LV vol d, MOD    72.9 ml                    fraction by A2C:                                        3D volume LV vol d, MOD    66.7 ml                    is 62 %. A4C: LV vol s, MOD    20.0 ml A2C:                           3D Volume EF: LV vol s, MOD    23.3 ml       3D EF:  62 % A4C: LV SV MOD A2C:   52.9 ml LV SV MOD A4C:   66.7 ml LV SV MOD BP:    50.1 ml RIGHT VENTRICLE             IVC RV S prime:     15.90 cm/s  IVC diam: 2.10 cm TAPSE (M-mode): 2.7 cm LEFT ATRIUM             Index       RIGHT ATRIUM          Index LA diam:        3.20 cm 1.81 cm/m  RA Area:     6.45 cm LA Vol (A2C):   27.5 ml 15.55 ml/m RA Volume:   8.04 ml  4.55 ml/m LA Vol (A4C):   27.6 ml 15.60 ml/m LA Biplane Vol: 27.5 ml 15.55 ml/m  AORTIC VALVE AV Area (Vmax):    1.80 cm AV Area (Vmean):   1.61 cm AV Area (VTI):     1.52 cm AV Vmax:           287.00 cm/s AV Vmean:          209.000 cm/s AV VTI:            0.641 m AV Peak Grad:      32.9 mmHg AV Mean Grad:      19.0 mmHg LVOT Vmax:         164.00 cm/s LVOT Vmean:        107.250 cm/s LVOT VTI:          0.310 m LVOT/AV VTI ratio: 0.48  AORTA Ao Root diam: 3.00 cm Ao Asc diam:  3.70 cm MITRAL VALVE MV Area (PHT): 4.68 cm     SHUNTS MV Decel Time: 162 msec     Systemic VTI:  0.31 m MV E velocity: 105.00 cm/s  Systemic Diam: 2.00 cm MV A velocity: 145.00 cm/s MV E/A ratio:  0.72 Fransico Him MD Electronically signed by Fransico Him MD Signature Date/Time: 10/06/2020/12:25:34 PM    Final    IR THORACENTESIS ASP PLEURAL SPACE W/IMG GUIDE  Result Date: 10/05/2020 INDICATION: Right pleural effusion, shortness of breast.  Request for therapeutic and diagnostic thoracentesis. EXAM: ULTRASOUND GUIDED RIGHT THORACENTESIS MEDICATIONS: 10 mL 1% lidocaine COMPLICATIONS: None immediate. PROCEDURE: An ultrasound guided thoracentesis was thoroughly discussed with the patient and questions answered. The benefits, risks, alternatives and complications were also discussed. The patient understands and wishes to proceed with the procedure. Written consent was obtained. Ultrasound was performed to localize and mark an adequate pocket of fluid in the right chest. The area was then prepped and draped in the normal sterile fashion. 1% Lidocaine was used for local anesthesia. Under ultrasound guidance a 6 Fr Safe-T-Centesis catheter was introduced. Thoracentesis was performed. The catheter was removed and a dressing applied. FINDINGS: A total of approximately 1.9 L of clear amber fluid was removed. Samples were sent to the laboratory as requested by the clinical team. Postprocedural chest x-ray ordered, pending. IMPRESSION: Successful ultrasound guided right thoracentesis yielding 1.9 L of pleural fluid. Read by: Durenda Guthrie, PA-C Electronically Signed   By: Aletta Edouard M.D.   On: 10/05/2020 16:58    Microbiology: Recent Results (from the past 240 hour(s))  Resp Panel by RT-PCR (Flu A&B, Covid) Nasopharyngeal Swab     Status: None   Collection Time: 10/05/20  1:07 PM   Specimen: Nasopharyngeal Swab; Nasopharyngeal(NP) swabs in vial transport medium  Result Value Ref  Range Status   SARS Coronavirus 2 by RT PCR NEGATIVE NEGATIVE Final    Comment: (NOTE) SARS-CoV-2 target nucleic acids are NOT DETECTED.  The SARS-CoV-2 RNA is generally detectable in upper respiratory specimens during the acute phase of infection. The lowest concentration of SARS-CoV-2 viral copies this assay can detect is 138 copies/mL. A negative result does not preclude SARS-Cov-2 infection and should not be used as the sole basis for treatment or other patient  management decisions. A negative result may occur with  improper specimen collection/handling, submission of specimen other than nasopharyngeal swab, presence of viral mutation(s) within the areas targeted by this assay, and inadequate number of viral copies(<138 copies/mL). A negative result must be combined with clinical observations, patient history, and epidemiological information. The expected result is Negative.  Fact Sheet for Patients:  EntrepreneurPulse.com.au  Fact Sheet for Healthcare Providers:  IncredibleEmployment.be  This test is no t yet approved or cleared by the [place] FDA and  has been authorized for detection and/or diagnosis of SARS-CoV-2 by FDA under an Emergency Use Authorization (EUA). This EUA will remain  in effect (meaning this test can be used) for the duration of the COVID-19 declaration under Section 564(b)(1) of the Act, 21 U.S.C.section 360bbb-3(b)(1), unless the authorization is terminated  or revoked sooner.       Influenza A by PCR NEGATIVE NEGATIVE Final   Influenza B by PCR NEGATIVE NEGATIVE Final    Comment: (NOTE) The Xpert Xpress SARS-CoV-2/FLU/RSV plus assay is intended as an aid in the diagnosis of influenza from Nasopharyngeal swab specimens and should not be used as a sole basis for treatment. Nasal washings and aspirates are unacceptable for Xpert Xpress SARS-CoV-2/FLU/RSV testing.  Fact Sheet for Patients: EntrepreneurPulse.com.au  Fact Sheet for Healthcare Providers: IncredibleEmployment.be  This test is not yet approved or cleared by the [place] FDA and has been authorized for detection and/or diagnosis of SARS-CoV-2 by FDA under an Emergency Use Authorization (EUA). This EUA will remain in effect (meaning this test can be used) for the duration of the COVID-19 declaration under Section 564(b)(1) of the Act, 21 U.S.C. section 360bbb-3(b)(1),  unless the authorization is terminated or revoked.  Performed at [facility], [place] [address], [place], [place] [address]   Culture, blood (routine x 2)     Status: None (Preliminary result)   Collection Time: 10/05/20  1:22 PM   Specimen: BLOOD RIGHT WRIST  Result Value Ref Range Status   Specimen Description BLOOD RIGHT WRIST  Final   Special Requests   Final    BOTTLES DRAWN AEROBIC AND ANAEROBIC Blood Culture adequate volume   Culture   Final    NO GROWTH 4 DAYS Performed at [facility], [place] [address], [place], [place] [address]    Report Status PENDING  Incomplete  Culture, blood (routine x 2)     Status: None (Preliminary result)   Collection Time: 10/05/20  1:22 PM   Specimen: BLOOD  Result Value Ref Range Status   Specimen Description BLOOD LEFT ANTECUBITAL  Final   Special Requests   Final    BOTTLES DRAWN AEROBIC AND ANAEROBIC Blood Culture adequate volume   Culture   Final    NO GROWTH 4 DAYS Performed at [facility], [place] [address], [place], [place] [address]    Report Status PENDING  Incomplete  Gram stain     Status: None   Collection Time: 10/05/20  5:03 PM   Specimen: Lung, Right;  Pleural Fluid  Result Value Ref Range Status   Specimen Description PLEURAL RIGHT LUNG  Final   Special Requests NONE  Final   Gram Stain   Final    WBC PRESENT, PREDOMINANTLY MONONUCLEAR NO ORGANISMS SEEN CYTOSPIN SMEAR Performed at [facility], [address] [place], [place] [address]    Report Status 10/05/2020 FINAL  Final  Acid Fast Smear (AFB)     Status: None   Collection Time: 10/05/20  5:03 PM   Specimen: Lung, Right; Pleural Fluid  Result Value Ref Range Status   AFB Specimen Processing Concentration  Final   Acid Fast Smear Negative  Final    Comment: (NOTE) Performed At: [facility] [place], [place] [id number] Rush Farmer MD BE:[id number]    Source (AFB) FLUID  Final    Comment:  PLEURAL RIGHT Performed at [facility], [place] [address], [place], [place] [address]   Culture, body fluid w Gram Stain-bottle     Status: None (Preliminary result)   Collection Time: 10/05/20  5:03 PM   Specimen: Fluid  Result Value Ref Range Status   Specimen Description FLUID RIGHT LUNG  Final   Special Requests BOTTLES DRAWN AEROBIC AND ANAEROBIC  Final   Culture   Final    NO GROWTH 4 DAYS Performed at [facility], [place] [address] [place], [place] [address]    Report Status PENDING  Incomplete     Labs: Basic Metabolic Panel: Recent Labs  Lab 10/03/20 1348 10/05/20 1306 10/06/20 0301  NA 135 133* 135  K 4.1 4.0 3.6  CL 97* 96* 96*  CO2 27 27 30   GLUCOSE 108* 100* 89  BUN 19 16 19   CREATININE 0.63 0.49* 0.54*  CALCIUM 9.8 9.2 9.0    Liver Function Tests: Recent Labs  Lab 10/03/20 1348 10/05/20 1306 10/06/20 0301  AST 44* 38 37  ALT 23 25 21   ALKPHOS 192* 150* 127*  BILITOT 0.6 0.7 0.7  PROT 7.5 6.8 6.2*  ALBUMIN 2.7* 2.5* 2.9*    No results for input(s): LIPASE, AMYLASE in the last 168 hours. No results for input(s): AMMONIA in the last 168 hours. CBC: Recent Labs  Lab 10/03/20 1348 10/05/20 1306 10/06/20 0301  WBC 14.7* 16.6* 16.2*  NEUTROABS 13.2* 15.1*  --   HGB 13.5 13.4 12.3*  HCT 39.8 40.5 37.3*  MCV 90.0 92.3 93.3  PLT 404* 397 331    Cardiac Enzymes: No results for input(s): CKTOTAL, CKMB, CKMBINDEX, TROPONINI in the last 168 hours. BNP: BNP (last 3 results) Recent Labs    10/05/20 1306  BNP 151.4*     ProBNP (last 3 results) No results for input(s): PROBNP in the last 8760 hours.  CBG: No results for input(s): GLUCAP in the last 168 hours.     Signed:  Nita Sells MD   [facility] 10/09/2020, 9:51 AM

## 2020-10-09 NOTE — TOC Transition Note (Signed)
Transition of Care (TOC) - CM/SW Discharge Note Marvetta Gibbons RN, BSN Transitions of Care Unit 4E- RN Case Manager See Treatment Team for direct phone #    Patient Details  Name: Wesley Hunt MRN: [id number] Date of Birth: 1955-08-11  Transition of Care [facility]) CM/SW Contact:  Dawayne Patricia, RN Phone Number: 10/09/2020, 4:16 PM   Clinical Narrative:    Pt stable for transition home with Hospice, spoke with Cassandra at [facility] and she reports that pt has been accepted under Hospice, sister [name] confirmed address for d/c as [address] and Cassandra has placed order for DME needs with [employer]. Hopeful for delivery today, sister is preparing home for DME.  Cassandra will keep TOC updated on DME delivery.  1550- DME is set to be delivered between 3-6pm today.   1600- received call from [place] that DME delivery is at the home now to make delivery. Sister is prepared for pt to return home today and ready for transport to be arranged. Cassandra confirmed that Hospice is also set for pt to transport home and services to begin.  [facility] called to set up transport, and pt placed on list for transport home later this evening. Paperwork along with GOLD DNR and MOST form, placed on shadow chart.  Bedside RN updated.    Final next level of care: Home w Hospice Care Barriers to Discharge: No Barriers Identified   Patient Goals and CMS Choice Patient states their goals for this hospitalization and ongoing recovery are:: return home with family under hospice care CMS Medicare.gov Compare Post Acute Care list provided to:: Patient Represenative (must comment) Choice offered to / list presented to : Sibling  Discharge Placement                 Home with Hospice.       Discharge Plan and Services   Discharge Planning Services: CM Consult Post Acute Care Choice: Hospice          DME Arranged: Hospice Equipment Package Others DME Agency: [place] spoke with at [employer]: DME ordered per [facility] HH Arranged: Disease Management [place] Agency: [place] Date [place]: 10/08/20 Time HH Agency Contacted: 1000 Representative spoke with at [place]: [place] ([place]) Interventions     Readmission Risk Interventions Readmission Risk Prevention Plan 10/09/2020  Post Dischage Appt Complete  Medication Screening Complete  Transportation Screening Complete

## 2020-10-09 NOTE — Plan of Care (Signed)

## 2020-10-10 ENCOUNTER — Encounter: Payer: Self-pay | Admitting: Radiation Oncology

## 2020-10-10 ENCOUNTER — Encounter (HOSPITAL_COMMUNITY): Payer: Self-pay

## 2020-10-10 ENCOUNTER — Ambulatory Visit: Payer: Medicare Other | Admitting: Radiation Oncology

## 2020-10-10 ENCOUNTER — Ambulatory Visit (HOSPITAL_COMMUNITY): Admit: 2020-10-10 | Payer: Medicare Other | Admitting: Gastroenterology

## 2020-10-10 LAB — CULTURE, BLOOD (ROUTINE X 2)
Culture: NO GROWTH
Culture: NO GROWTH
Special Requests: ADEQUATE
Special Requests: ADEQUATE

## 2020-10-10 LAB — CULTURE, BODY FLUID W GRAM STAIN -BOTTLE: Culture: NO GROWTH

## 2020-10-10 SURGERY — COLONOSCOPY WITH PROPOFOL
Anesthesia: Monitor Anesthesia Care

## 2020-10-10 NOTE — Progress Notes (Signed)
  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  Narrative: The patient's status declined prior to proceeding with radiotherapy and he was hospitalized, and discharged with hospice.  Plan: We would be happy to revisit discussion as needed with the patient or family.     Carola Rhine, PAC

## 2020-10-11 ENCOUNTER — Ambulatory Visit: Payer: Medicare Other | Admitting: Radiation Oncology

## 2020-10-12 ENCOUNTER — Ambulatory Visit: Payer: Medicare Other | Admitting: Internal Medicine

## 2020-10-12 ENCOUNTER — Ambulatory Visit: Payer: Medicare Other

## 2020-10-13 ENCOUNTER — Ambulatory Visit: Payer: Medicare Other

## 2020-10-16 ENCOUNTER — Ambulatory Visit (HOSPITAL_COMMUNITY): Payer: Medicare Other

## 2020-10-16 ENCOUNTER — Ambulatory Visit: Payer: Medicare Other

## 2020-10-17 ENCOUNTER — Ambulatory Visit: Payer: Medicare Other

## 2020-10-18 ENCOUNTER — Other Ambulatory Visit: Payer: Medicare Other

## 2020-10-18 ENCOUNTER — Ambulatory Visit: Payer: Medicare Other | Admitting: Internal Medicine

## 2020-10-18 ENCOUNTER — Ambulatory Visit: Payer: Medicare Other

## 2020-10-19 ENCOUNTER — Ambulatory Visit: Payer: Medicare Other

## 2020-10-20 ENCOUNTER — Ambulatory Visit: Payer: Medicare Other

## 2020-10-23 ENCOUNTER — Ambulatory Visit: Payer: Medicare Other | Admitting: Radiation Oncology

## 2020-10-23 ENCOUNTER — Ambulatory Visit: Payer: Medicare Other

## 2020-10-24 ENCOUNTER — Ambulatory Visit: Payer: Medicare Other

## 2020-10-27 DEATH — deceased

## 2020-11-21 LAB — ACID FAST CULTURE WITH REFLEXED SENSITIVITIES (MYCOBACTERIA): Acid Fast Culture: NEGATIVE

## 2021-09-01 IMAGING — DX DG CHEST 1V PORT
1 series · 1 of 1 positions shown · non-contrast
Comparison: PET scan on 09/18/2020

CLINICAL DATA: Status post right thoracentesis with removal of
L of fluid. Prior imaging has demonstrated evidence of extensive
hypermetabolic tumor in the right chest with metastatic
lymphadenopathy.

EXAM:
PORTABLE CHEST 1 VIEW

[chest ap]
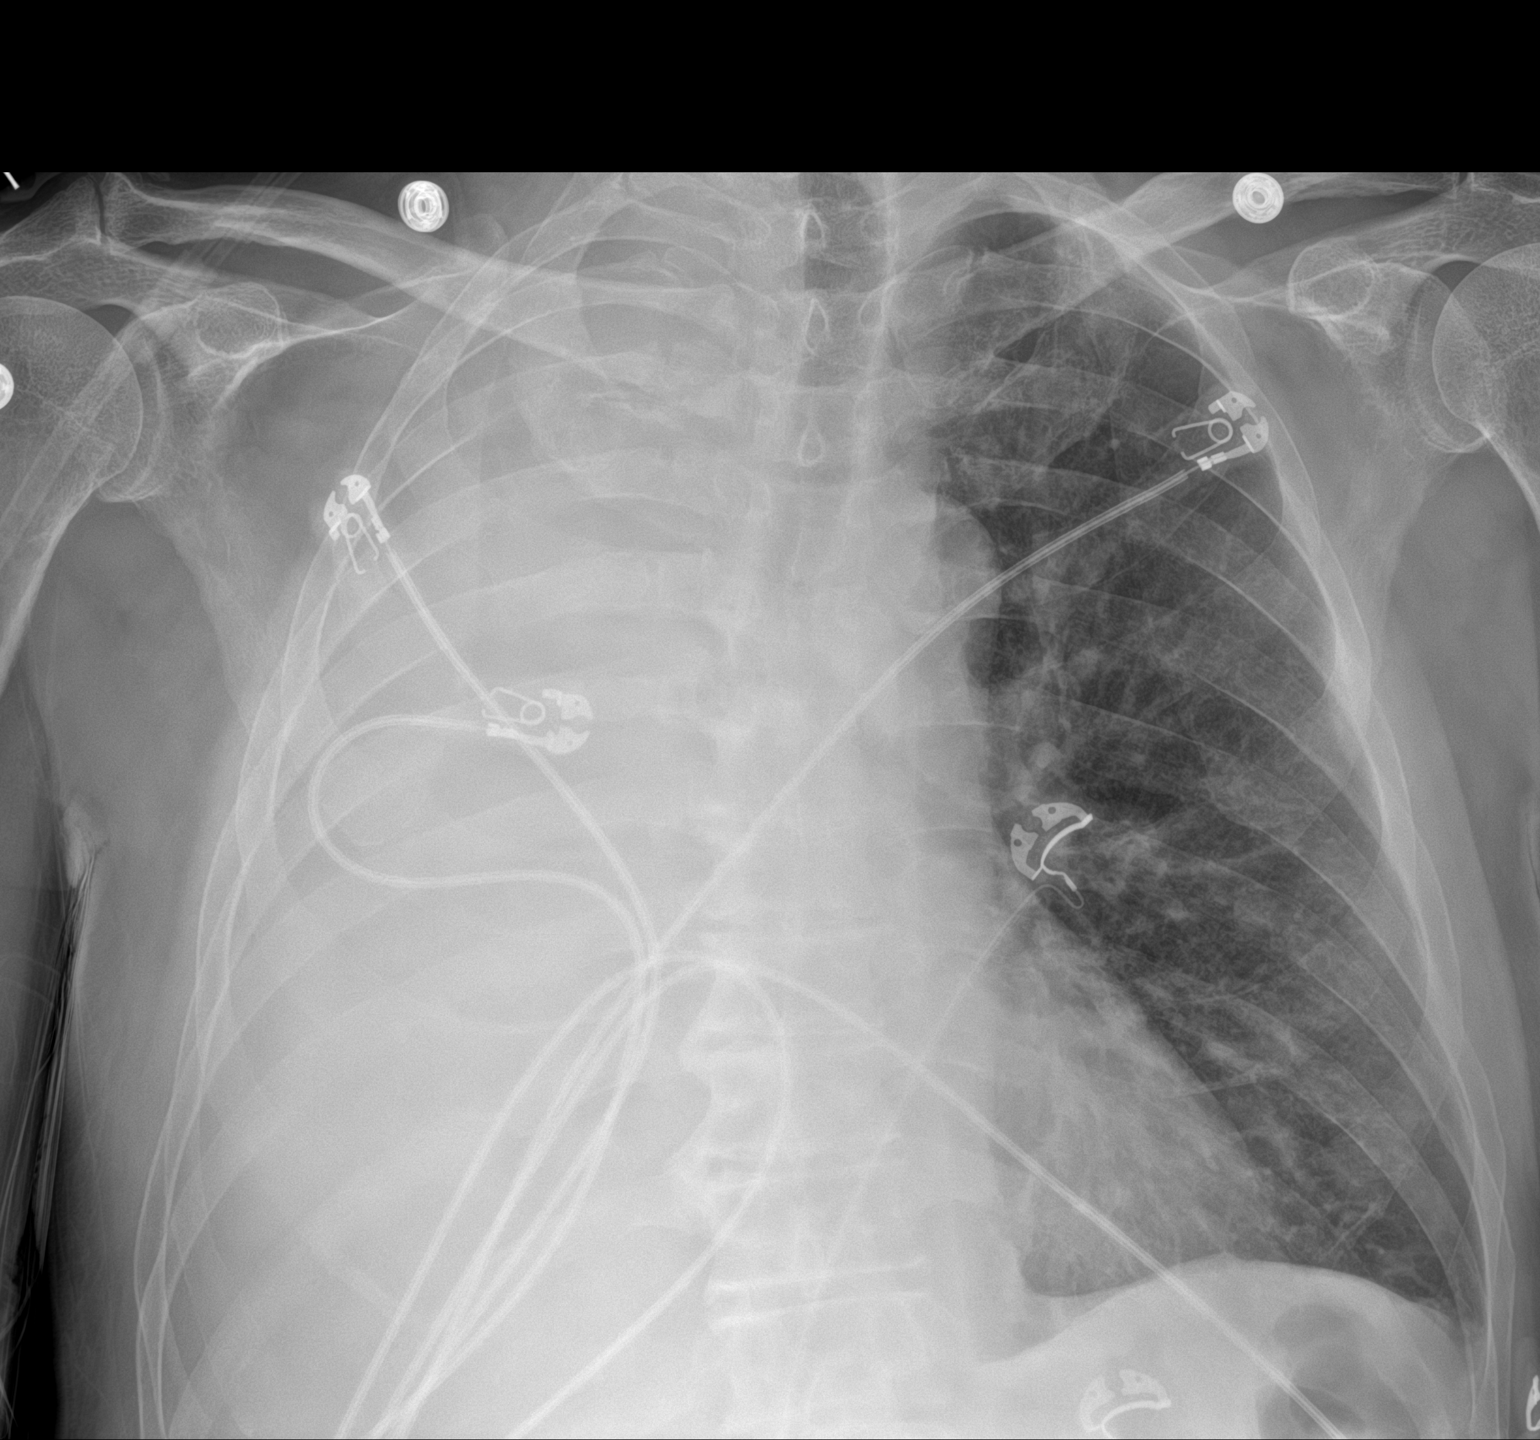

[1 of 1 positions shown; findings below may reference images not displayed]

FINDINGS: After thoracentesis there is a minimal amount of aeration of the
right upper lung with residual dense consolidation of the right lung
and probably some residual pleural fluid remaining. Findings are
consistent with obstructive tumor centrally which is not allowing
much aeration to occur of the underlying lung. No pneumothorax.
Expansion and aeration of the left lung is improved compared to the
chest x-ray earlier today.
IMPRESSION: Minimal aeration of the right upper lung after large volume
right-sided thoracentesis. Findings are consistent with known
obstructive tumor centrally which is not allowing much aeration to
occur of the right lung. Left lung expansion has improved. No
pneumothorax.

## 2021-09-01 IMAGING — CR DG CHEST 1V
1 series · 1 of 1 positions shown · non-contrast
Comparison: 09/18/2020

CLINICAL DATA: Shortness of breath

EXAM:
CHEST  1 VIEW

[AP]
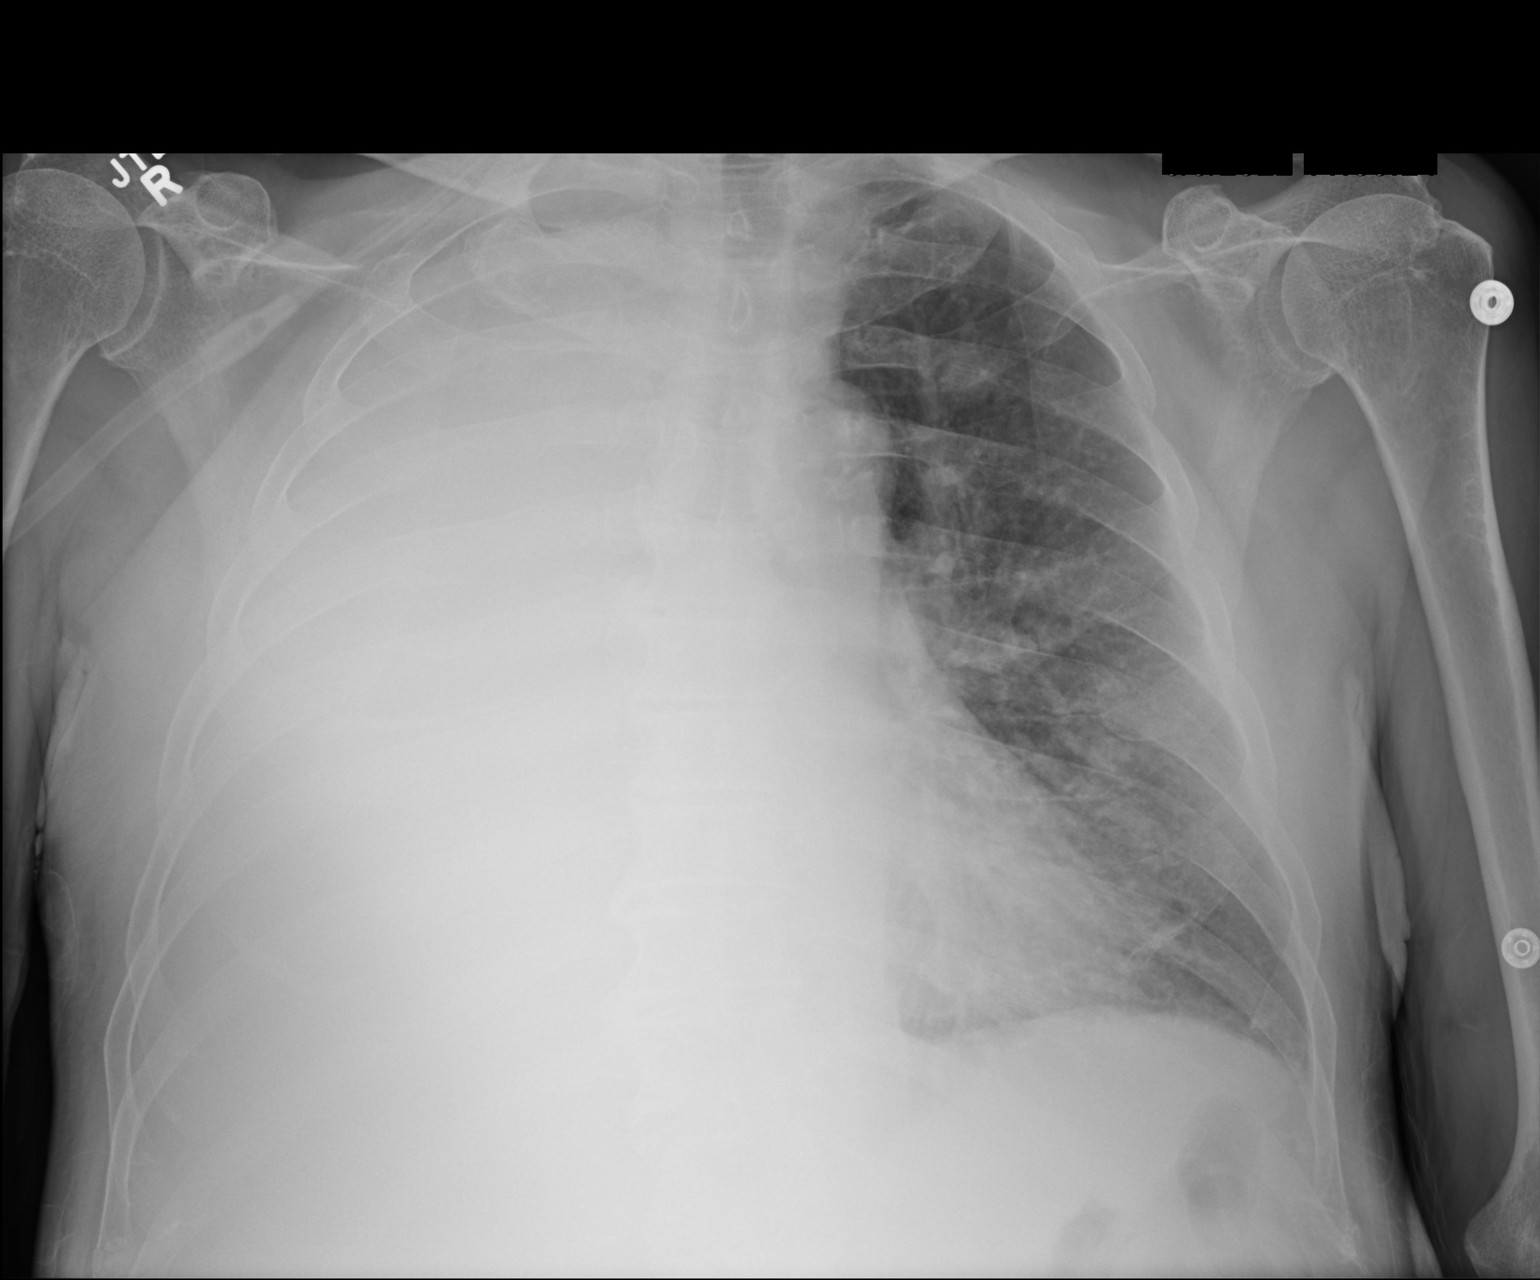

[1 of 1 positions shown; findings below may reference images not displayed]

FINDINGS: Complete opacification of the right hemithorax compatible with a
combination of pleural effusion and complete right lung atelectasis.
Diffuse interstitial opacities throughout the left lung. A skin fold
is seen partially superimposed over the left lung apex. No evidence
of pneumothorax. No pathologic fractures are seen.
IMPRESSION: Complete opacification of the right hemithorax compatible with a
combination of pleural effusion and complete right lung atelectasis.
Findings slightly progressed from recent PET-CT.

## 2021-09-01 IMAGING — US IR THORACENTESIS ASP PLEURAL SPACE W/IMG GUIDE
1 series · 2 of 2 positions shown · non-contrast
Comparison: none

INDICATION: Right pleural effusion, shortness of breast. Request for therapeutic
and diagnostic thoracentesis.

[Series 1: ir (id) (id)/(id)/(id) ir · 2 of 2 slices shown]
[im 1/2]
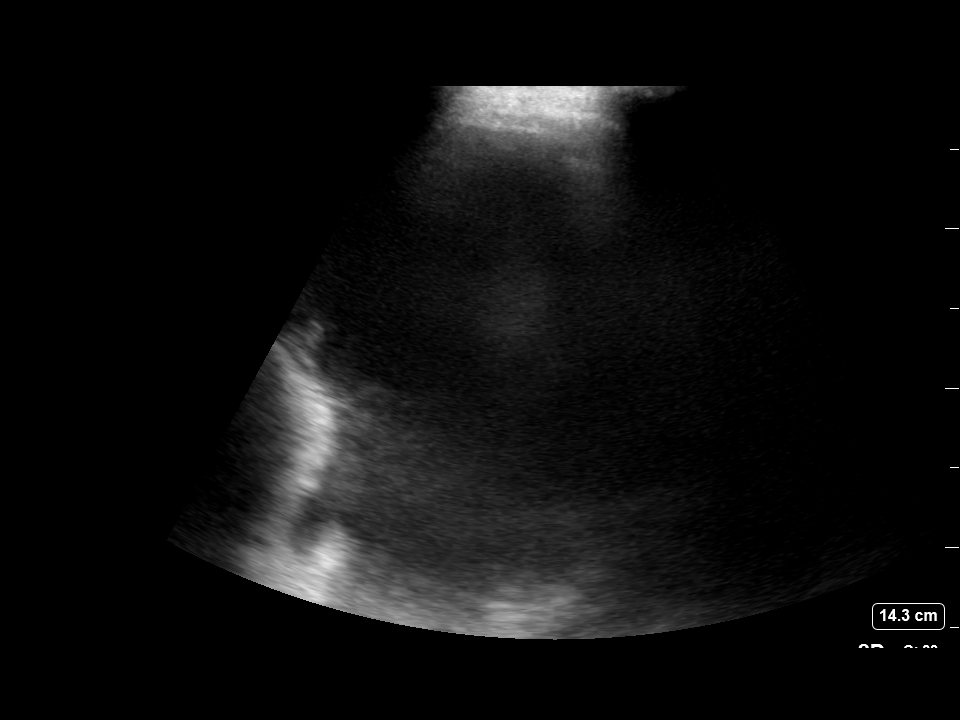
[im 2/2]
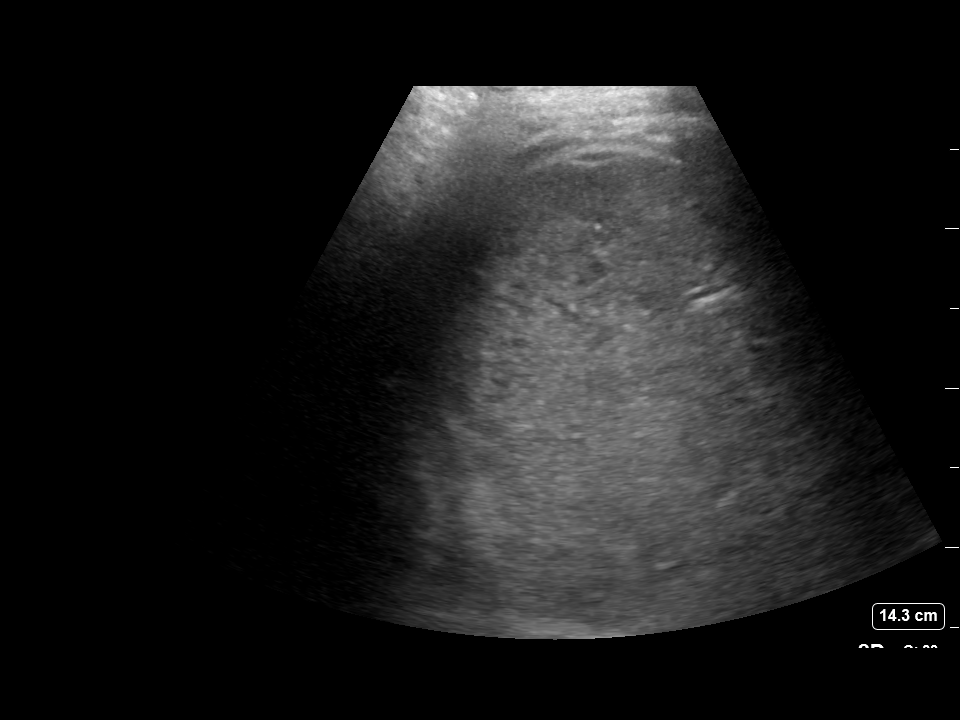

[2 of 2 positions shown; findings below may reference images not displayed]

EXAM:
ULTRASOUND GUIDED RIGHT THORACENTESIS

MEDICATIONS:
10 mL 1% lidocaine

COMPLICATIONS:
None immediate.

PROCEDURE:
An ultrasound guided thoracentesis was thoroughly discussed with the
patient and questions answered. The benefits, risks, alternatives
and complications were also discussed. The patient understands and
wishes to proceed with the procedure. Written consent was obtained.

Ultrasound was performed to localize and mark an adequate pocket of
fluid in the right chest. The area was then prepped and draped in
the normal sterile fashion. 1% Lidocaine was used for local
anesthesia. Under ultrasound guidance a 6 Fr Safe-T-Centesis
catheter was introduced. Thoracentesis was performed. The catheter
was removed and a dressing applied.
FINDINGS: A total of approximately 1.9 L of clear amber fluid was removed.
Samples were sent to the laboratory as requested by the clinical
team.

Postprocedural chest x-ray ordered, pending.
IMPRESSION: Successful ultrasound guided right thoracentesis yielding 1.9 L of
pleural fluid.

## 2021-09-02 IMAGING — DX DG CHEST 1V PORT
1 series · 1 of 1 positions shown · non-contrast
Comparison: October 05, 2020.

CLINICAL DATA: Pleural effusion.

EXAM:
PORTABLE CHEST 1 VIEW

[chest]
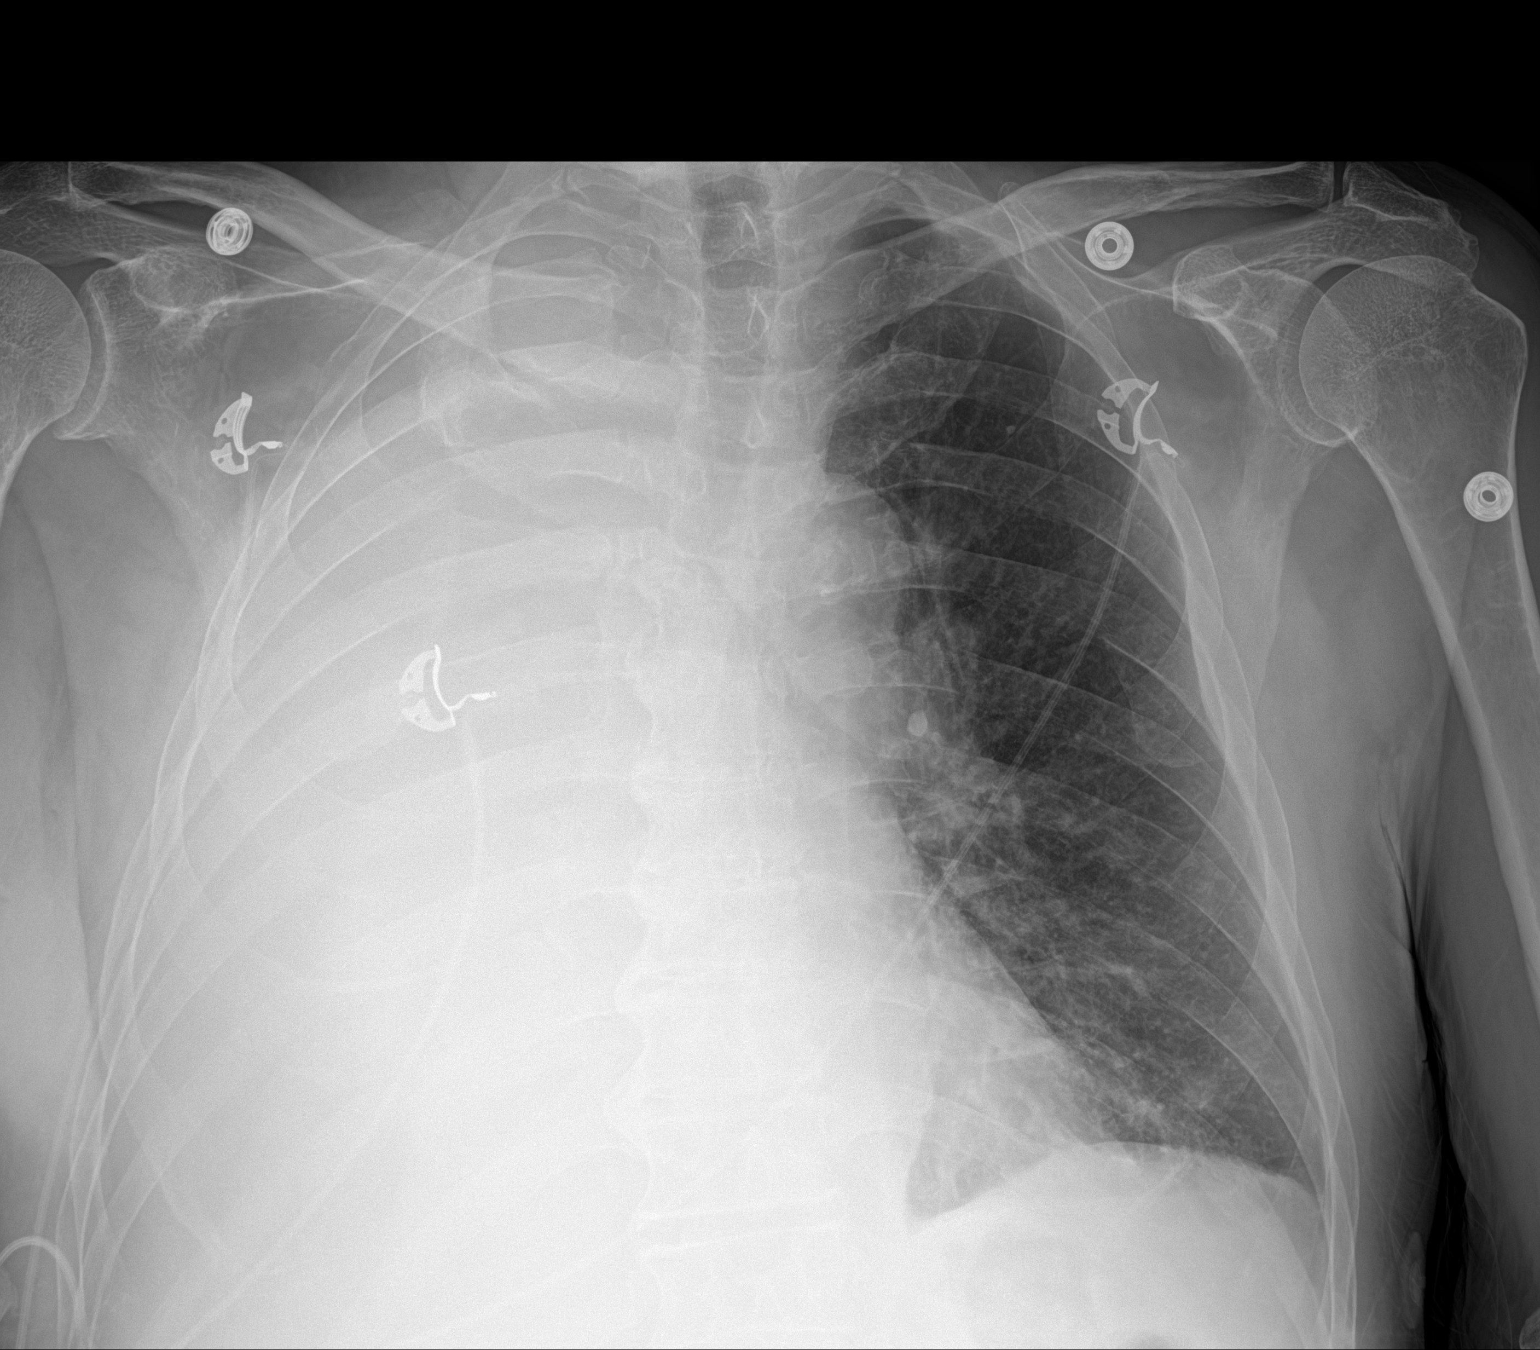

[1 of 1 positions shown; findings below may reference images not displayed]

FINDINGS: Stable complete opacification of right hemithorax consistent with
combination of effusion and atelectasis. Minimal left basilar
subsegmental atelectasis is noted. No pneumothorax is noted. Bony
thorax is unremarkable.
IMPRESSION: Stable complete opacification of right hemithorax is noted
consistent with combination of pleural effusion and atelectasis.
